# Patient Record
Sex: Female | Born: 1995
Health system: Southern US, Community
[De-identification: ages and names within clinical notes are randomized; demographics above are authoritative.]

## PROBLEM LIST (undated history)

## (undated) DIAGNOSIS — M797 Fibromyalgia: Secondary | ICD-10-CM

## (undated) DIAGNOSIS — G90A Postural orthostatic tachycardia syndrome (POTS): Secondary | ICD-10-CM

## (undated) DIAGNOSIS — I498 Other specified cardiac arrhythmias: Secondary | ICD-10-CM

## (undated) DIAGNOSIS — L88 Pyoderma gangrenosum: Secondary | ICD-10-CM

## (undated) DIAGNOSIS — R000 Tachycardia, unspecified: Secondary | ICD-10-CM

## (undated) DIAGNOSIS — I951 Orthostatic hypotension: Secondary | ICD-10-CM

## (undated) HISTORY — PX: BREAST SURGERY: SHX581

## (undated) HISTORY — PX: KNEE SURGERY: SHX244

## (undated) HISTORY — PX: ANKLE SURGERY: SHX546

## (undated) HISTORY — PX: ADENOIDECTOMY: SUR15

## (undated) HISTORY — PX: TONSILLECTOMY: SUR1361

## (undated) HISTORY — PX: OTHER SURGICAL HISTORY: SHX169

---
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
## 2013-02-01 ENCOUNTER — Other Ambulatory Visit: Payer: Self-pay | Admitting: *Deleted

## 2013-02-01 MED ORDER — CYCLOBENZAPRINE HCL 5 MG PO TABS: ORAL_TABLET | ORAL | Status: DC

## 2013-02-16 ENCOUNTER — Ambulatory Visit (INDEPENDENT_AMBULATORY_CARE_PROVIDER_SITE_OTHER): Payer: 59 | Admitting: Family Medicine

## 2013-02-16 ENCOUNTER — Encounter: Payer: Self-pay | Admitting: Family Medicine

## 2013-02-16 VITALS — BP 120/70 | Ht 64.0 in | Wt 150.0 lb

## 2013-02-16 DIAGNOSIS — M629 Disorder of muscle, unspecified: Secondary | ICD-10-CM

## 2013-02-16 DIAGNOSIS — M7631 Iliotibial band syndrome, right leg: Secondary | ICD-10-CM

## 2013-02-21 NOTE — Progress Notes (Signed)
   Subjective:    Patient ID: Amber Tyler, female    DOB: 04-Jul-1995, 17 y.o.   MRN: [id number]  HPI Right leg pain. 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 After yesterday's PT, she felt some aching in the right side of her knee. Today when she woke up it was worse. The swelling. No giving way.   Review of Systems See history of present illness.    Objective:   Physical Exam Vital signs are reviewed GENERAL: Well-developed female no acute distress KNEE: Right. Full range of motion in flexion extension. Ligamentously intact to varus and valgus stress. Tender to palpation at the insertion of the IT band calf is soft. Distally she is neurovascularly intact. There is no knee effusion. No erythema, no warmth.       Assessment & Plan:  Mild IT band strain. Discussed rehabilitation exercises.

## 2013-03-05 ENCOUNTER — Other Ambulatory Visit: Payer: Self-pay | Admitting: Family Medicine

## 2013-03-05 MED ORDER — PANTOPRAZOLE SODIUM 40 MG PO TBEC: 40.0000 mg | DELAYED_RELEASE_TABLET | Freq: Every day | ORAL | Status: DC

## 2013-04-04 ENCOUNTER — Encounter: Payer: Self-pay | Admitting: Family Medicine

## 2013-04-04 ENCOUNTER — Ambulatory Visit (INDEPENDENT_AMBULATORY_CARE_PROVIDER_SITE_OTHER): Payer: 59 | Admitting: Family Medicine

## 2013-04-04 VITALS — BP 112/66 | HR 89 | Temp 98.3°F | Wt 160.5 lb

## 2013-04-04 DIAGNOSIS — R51 Headache: Secondary | ICD-10-CM

## 2013-04-04 DIAGNOSIS — R519 Headache, unspecified: Secondary | ICD-10-CM

## 2013-04-04 MED ORDER — VERAPAMIL HCL ER 100 MG PO CP24: 100.0000 mg | ORAL_CAPSULE | Freq: Every day | ORAL | Status: DC

## 2013-04-05 DIAGNOSIS — R519 Headache, unspecified: Secondary | ICD-10-CM | POA: Insufficient documentation

## 2013-04-05 DIAGNOSIS — R51 Headache: Principal | ICD-10-CM

## 2013-04-05 NOTE — Progress Notes (Signed)
   Subjective:    Patient ID: Amber Tyler, female    DOB: Mar 01, 1996, 18 y.o.   MRN: [id number]  HPI #1. Complaint of headaches. Having them or 5 days out of the week. Light sensitivity. Right side of the top part of her head. No or. No emesis. No specific trigger. Has not been on her Depakote for several months secondary to perceived weight gain. She says her meds have been pretty stable.   Review of Systems Other than sensitivity to light during the headache she's had no visual disturbances, no tinnitus, no weakness.    Objective:   Physical Exam  Vital signs reviewed. GENERAL: Well-developed, well-nourished, no acute distress. HEENT: Pupils equal reactive to light accommodation. No notable icterus. Extraocular muscles intact. CARDIOVASCULAR: Regular rate and rhythm no murmur gallop or rub LUNGS: Clear to auscultation bilaterally, no rales or wheeze. ABDOMEN: Soft positive bowel sounds NEURO: No gross focal neurological deficits. MSK: Movement of extremity x 4. PSYCHIATRIC: Alert and oriented x4. Normal affect. Interactive. Speech is normal in content and fluency. No agitation or psychomotor retardation.        Assessment & Plan:  #1. Headaches. Unclear exactly what these are. We'll start her on some parenteral female to see if we can decrease the frequency. I wanted her to go back on her Depakote but she does not do that secondary weight gain. I'll see her back in one month.

## 2013-04-13 ENCOUNTER — Encounter: Payer: Self-pay | Admitting: Family Medicine

## 2013-04-13 ENCOUNTER — Other Ambulatory Visit: Payer: Self-pay | Admitting: Family Medicine

## 2013-04-13 DIAGNOSIS — R51 Headache: Principal | ICD-10-CM

## 2013-04-13 DIAGNOSIS — R519 Headache, unspecified: Secondary | ICD-10-CM

## 2013-04-13 MED ORDER — METOPROLOL TARTRATE 25 MG PO TABS: 25.0000 mg | ORAL_TABLET | Freq: Two times a day (BID) | ORAL | Status: DC

## 2013-04-13 NOTE — Assessment & Plan Note (Signed)
Can't afford verapamil

## 2013-04-13 NOTE — Progress Notes (Signed)
Patient ID: Amber Tyler, female   DOB: 12-08-1995, 18 y.o.   MRN: [id number] Can't afford verapamil

## 2013-05-09 ENCOUNTER — Ambulatory Visit: Payer: 59 | Admitting: Family Medicine

## 2013-06-26 ENCOUNTER — Ambulatory Visit: Payer: 59

## 2013-08-02 ENCOUNTER — Other Ambulatory Visit: Payer: Self-pay | Admitting: *Deleted

## 2013-08-02 MED ORDER — PANTOPRAZOLE SODIUM 40 MG PO TBEC: 40.0000 mg | DELAYED_RELEASE_TABLET | Freq: Every day | ORAL | Status: DC

## 2013-08-03 ENCOUNTER — Encounter: Payer: Self-pay | Admitting: Family Medicine

## 2013-08-08 ENCOUNTER — Emergency Department (HOSPITAL_BASED_OUTPATIENT_CLINIC_OR_DEPARTMENT_OTHER): Payer: 59

## 2013-08-08 ENCOUNTER — Encounter (HOSPITAL_BASED_OUTPATIENT_CLINIC_OR_DEPARTMENT_OTHER): Payer: Self-pay | Admitting: Emergency Medicine

## 2013-08-08 ENCOUNTER — Emergency Department (HOSPITAL_BASED_OUTPATIENT_CLINIC_OR_DEPARTMENT_OTHER)
Admission: EM | Admit: 2013-08-08 | Discharge: 2013-08-08 | Disposition: A | Discharge status: Home / Self Care | Payer: 59 | Attending: Emergency Medicine | Admitting: Emergency Medicine

## 2013-08-08 DIAGNOSIS — S63509A Unspecified sprain of unspecified wrist, initial encounter: Secondary | ICD-10-CM | POA: Insufficient documentation

## 2013-08-08 DIAGNOSIS — X58XXXA Exposure to other specified factors, initial encounter: Secondary | ICD-10-CM | POA: Insufficient documentation

## 2013-08-08 DIAGNOSIS — J45909 Unspecified asthma, uncomplicated: Secondary | ICD-10-CM | POA: Insufficient documentation

## 2013-08-08 DIAGNOSIS — Z79899 Other long term (current) drug therapy: Secondary | ICD-10-CM | POA: Insufficient documentation

## 2013-08-08 DIAGNOSIS — Z87891 Personal history of nicotine dependence: Secondary | ICD-10-CM | POA: Insufficient documentation

## 2013-08-08 DIAGNOSIS — Y939 Activity, unspecified: Secondary | ICD-10-CM | POA: Insufficient documentation

## 2013-08-08 DIAGNOSIS — Y929 Unspecified place or not applicable: Secondary | ICD-10-CM | POA: Insufficient documentation

## 2013-08-08 DIAGNOSIS — S63502A Unspecified sprain of left wrist, initial encounter: Secondary | ICD-10-CM

## 2013-08-08 MED ORDER — IBUPROFEN 800 MG PO TABS: 800.0000 mg | ORAL_TABLET | Freq: Three times a day (TID) | ORAL | Status: DC

## 2013-08-08 MED ORDER — HYDROCODONE-ACETAMINOPHEN 5-325 MG PO TABS: 1.0000 | ORAL_TABLET | ORAL | Status: DC | PRN

## 2013-08-08 NOTE — ED Provider Notes (Signed)
CSN: [id number]     Arrival date & time 08/08/13  2031 History   First MD Initiated Contact with Patient 08/08/13 2131     Chief Complaint  Patient presents with  . Wrist Pain     (Consider location/radiation/quality/duration/timing/severity/associated sxs/prior Treatment) Patient is a 18 y.o. female presenting with wrist pain. The history is provided by the patient. No language interpreter was used.  Wrist Pain This is a new problem. The current episode started yesterday. The problem occurs constantly. The problem has been gradually worsening. Pertinent negatives include no chills, fever or numbness. Associated symptoms comments: Left wrist pain without known injury. She has noticed pain in the ulnar wrist that extends to the 4th and 5th digits. Painful movement without numbness or tingling..    Past Medical History  Diagnosis Date  . Asthma    Past Surgical History  Procedure Laterality Date  . Tonsillectomy    . Adenoidectomy     History reviewed. No pertinent family history. History  Substance Use Topics  . Smoking status: Former Research scientist (life sciences)  . Smokeless tobacco: Not on file  . Alcohol Use: No   OB History   Grav Para Term Preterm Abortions TAB SAB Ect Mult Living                 Review of Systems  Constitutional: Negative for fever and chills.  Musculoskeletal:       See HPI.  Skin: Negative.  Negative for color change.  Neurological: Negative.  Negative for numbness.      Allergies  Ondansetron hcl; Sulfonamide derivatives; and Zoloft  Home Medications   Prior to Admission medications   Medication Sig Start Date End Date Taking? Authorizing Provider  cyclobenzaprine (FLEXERIL) 5 MG tablet Take one by mouth at night prn headache 02/01/13   Dickie La, MD  metoprolol tartrate (LOPRESSOR) 25 MG tablet Take 1 tablet (25 mg total) by mouth 2 (two) times daily. 04/13/13   Zigmund Gottron, MD  pantoprazole (PROTONIX) 40 MG tablet Take 1 tablet (40 mg total) by  mouth daily. 08/02/13   Dickie La, MD   BP 121/79  Pulse 114  Temp(Src) 98.3 F (36.8 C) (Oral)  Resp 20  Ht 5\' 4"  (1.626 m)  Wt 165 lb (74.844 kg)  BMI 28.31 kg/m2  SpO2 100%  LMP 07/27/2013 Physical Exam  Constitutional: She is oriented to person, place, and time. She appears well-developed and well-nourished.  HENT:  Head: Normocephalic.  Neck: Normal range of motion.  Pulmonary/Chest: Effort normal.  Abdominal: There is no tenderness. There is no rebound and no guarding.  Musculoskeletal: Normal range of motion.  Left wrist without significant swelling. No discoloration. Point tenderness over ulnar aspect at dorsum and volar surfaces. Limited finger dexterity secondary to pain. No strength deficit.  Neurological: She is alert and oriented to person, place, and time.  Skin: Skin is warm and dry. No rash noted.  Psychiatric: She has a normal mood and affect.    ED Course  Procedures (including critical care time) Labs Review Labs Reviewed - No data to display  Imaging Review Dg Wrist Complete Left  08/08/2013   CLINICAL DATA:  Left wrist pain  EXAM: LEFT WRIST - COMPLETE 3+ VIEW  COMPARISON:  None.  FINDINGS: There is no evidence of fracture or dislocation. There is no evidence of arthropathy or other focal bone abnormality. Soft tissues are unremarkable.  IMPRESSION: Negative.   Electronically Signed   By: Tamera Punt.D.  On: 08/08/2013 21:09     EKG Interpretation None      MDM   Final diagnoses:  None    1. Left wrist sprain  Uncomplicated left wrist sprain.    Dewaine Oats, PA-C 08/08/13 2206

## 2013-08-08 NOTE — ED Notes (Signed)
Pt c/o left wrist pain with swelling x 1 day denies injury

## 2013-08-08 NOTE — ED Provider Notes (Signed)
Medical screening examination/treatment/procedure(s) were performed by non-physician practitioner and as supervising physician I was immediately available for consultation/collaboration.   EKG Interpretation None        Charles B. Karle Starch, MD 08/08/13 2207

## 2013-08-08 NOTE — Discharge Instructions (Signed)
Cryotherapy Cryotherapy means treatment with cold. Ice or gel packs can be used to reduce both pain and swelling. Ice is the most helpful within the first 24 to 48 hours after an injury or flareup from overusing a muscle or joint. Sprains, strains, spasms, burning pain, shooting pain, and aches can all be eased with ice. Ice can also be used when recovering from surgery. Ice is effective, has very few side effects, and is safe for most people to use. PRECAUTIONS  Ice is not a safe treatment option for people with:  Raynaud's phenomenon. This is a condition affecting small blood vessels in the extremities. Exposure to cold may cause your problems to return.  Cold hypersensitivity. There are many forms of cold hypersensitivity, including:  Cold urticaria. Red, itchy hives appear on the skin when the tissues begin to warm after being iced.  Cold erythema. This is a red, itchy rash caused by exposure to cold.  Cold hemoglobinuria. Red blood cells break down when the tissues begin to warm after being iced. The hemoglobin that carry oxygen are passed into the urine because they cannot combine with blood proteins fast enough.  Numbness or altered sensitivity in the area being iced. If you have any of the following conditions, do not use ice until you have discussed cryotherapy with your caregiver:  Heart conditions, such as arrhythmia, angina, or chronic heart disease.  High blood pressure.  Healing wounds or open skin in the area being iced.  Current infections.  Rheumatoid arthritis.  Poor circulation.  Diabetes. Ice slows the blood flow in the region it is applied. This is beneficial when trying to stop inflamed tissues from spreading irritating chemicals to surrounding tissues. However, if you expose your skin to cold temperatures for too long or without the proper protection, you can damage your skin or nerves. Watch for signs of skin damage due to cold. HOME CARE INSTRUCTIONS Follow  these tips to use ice and cold packs safely.  Place a dry or damp towel between the ice and skin. A damp towel will cool the skin more quickly, so you may need to shorten the time that the ice is used.  For a more rapid response, add gentle compression to the ice.  Ice for no more than 10 to 20 minutes at a time. The bonier the area you are icing, the less time it will take to get the benefits of ice.  Check your skin after 5 minutes to make sure there are no signs of a poor response to cold or skin damage.  Rest 20 minutes or more in between uses.  Once your skin is numb, you can end your treatment. You can test numbness by very lightly touching your skin. The touch should be so light that you do not see the skin dimple from the pressure of your fingertip. When using ice, most people will feel these normal sensations in this order: cold, burning, aching, and numbness.  Do not use ice on someone who cannot communicate their responses to pain, such as small children or people with dementia. HOW TO MAKE AN ICE PACK Ice packs are the most common way to use ice therapy. Other methods include ice massage, ice baths, and cryo-sprays. Muscle creams that cause a cold, tingly feeling do not offer the same benefits that ice offers and should not be used as a substitute unless recommended by your caregiver. To make an ice pack, do one of the following:  Place crushed ice or  a bag of frozen vegetables in a sealable plastic bag. Squeeze out the excess air. Place this bag inside another plastic bag. Slide the bag into a pillowcase or place a damp towel between your skin and the bag. °· Mix 3 parts water with 1 part rubbing alcohol. Freeze the mixture in a sealable plastic bag. When you remove the mixture from the freezer, it will be slushy. Squeeze out the excess air. Place this bag inside another plastic bag. Slide the bag into a pillowcase or place a damp towel between your skin and the bag. °SEEK MEDICAL  CARE IF: °· You develop white spots on your skin. This may give the skin a blotchy (mottled) appearance. °· Your skin turns blue or pale. °· Your skin becomes waxy or hard. °· Your swelling gets worse. °MAKE SURE YOU:  °· Understand these instructions. °· Will watch your condition. °· Will get help right away if you are not doing well or get worse. °Document Released: 11/09/2010 Document Revised: 06/07/2011 Document Reviewed: 11/09/2010 °ExitCare® Patient Information ©2014 [employer]. ° °Joint Sprain °A sprain is a tear or stretch in the ligaments that hold a joint together. Severe sprains may need as long as 3-6 weeks of immobilization and/or exercises to heal completely. Sprained joints should be rested and protected. If not, they can become unstable and prone to re-injury. Proper treatment can reduce your pain, shorten the period of disability, and reduce the risk of repeated injuries. °TREATMENT  °· Rest and elevate the injured joint to reduce pain and swelling. °· Apply ice packs to the injury for 20-30 minutes every 2-3 hours for the next 2-3 days. °· Keep the injury wrapped in a compression bandage or splint as long as the joint is painful or as instructed by your caregiver. °· Do not use the injured joint until it is completely healed to prevent re-injury and chronic instability. Follow the instructions of your caregiver. °· Long-term sprain management may require exercises and/or treatment by a physical therapist. Taping or special braces may help stabilize the joint until it is completely better. °SEEK MEDICAL CARE IF:  °· You develop increased pain or swelling of the joint. °· You develop increasing redness and warmth of the joint. °· You develop a fever. °· It becomes stiff. °· Your hand or foot gets cold or numb. °Document Released: 04/22/2004 Document Revised: 06/07/2011 Document Reviewed: 04/01/2008 °ExitCare® Patient Information ©2014 [employer]. ° °

## 2013-08-21 ENCOUNTER — Other Ambulatory Visit: Payer: Self-pay | Admitting: *Deleted

## 2013-08-21 DIAGNOSIS — R51 Headache: Principal | ICD-10-CM

## 2013-08-21 DIAGNOSIS — R519 Headache, unspecified: Secondary | ICD-10-CM

## 2013-08-21 MED ORDER — METOPROLOL TARTRATE 25 MG PO TABS: 25.0000 mg | ORAL_TABLET | Freq: Two times a day (BID) | ORAL | Status: DC

## 2013-09-24 ENCOUNTER — Other Ambulatory Visit: Payer: Self-pay | Admitting: *Deleted

## 2013-09-24 MED ORDER — DESOGESTREL-ETHINYL ESTRADIOL 0.15-30 MG-MCG PO TABS: 1.0000 | ORAL_TABLET | Freq: Every day | ORAL | Status: DC

## 2013-10-13 ENCOUNTER — Emergency Department (HOSPITAL_BASED_OUTPATIENT_CLINIC_OR_DEPARTMENT_OTHER)
Admission: EM | Admit: 2013-10-13 | Discharge: 2013-10-13 | Disposition: A | Discharge status: Home / Self Care | Payer: 59 | Attending: Emergency Medicine | Admitting: Emergency Medicine

## 2013-10-13 ENCOUNTER — Encounter (HOSPITAL_BASED_OUTPATIENT_CLINIC_OR_DEPARTMENT_OTHER): Payer: Self-pay | Admitting: Emergency Medicine

## 2013-10-13 DIAGNOSIS — W57XXXA Bitten or stung by nonvenomous insect and other nonvenomous arthropods, initial encounter: Secondary | ICD-10-CM

## 2013-10-13 DIAGNOSIS — S90569A Insect bite (nonvenomous), unspecified ankle, initial encounter: Secondary | ICD-10-CM | POA: Insufficient documentation

## 2013-10-13 DIAGNOSIS — Z79899 Other long term (current) drug therapy: Secondary | ICD-10-CM | POA: Insufficient documentation

## 2013-10-13 DIAGNOSIS — L509 Urticaria, unspecified: Secondary | ICD-10-CM

## 2013-10-13 DIAGNOSIS — Y9389 Activity, other specified: Secondary | ICD-10-CM | POA: Insufficient documentation

## 2013-10-13 DIAGNOSIS — Z87891 Personal history of nicotine dependence: Secondary | ICD-10-CM | POA: Insufficient documentation

## 2013-10-13 DIAGNOSIS — J45909 Unspecified asthma, uncomplicated: Secondary | ICD-10-CM | POA: Insufficient documentation

## 2013-10-13 DIAGNOSIS — Y929 Unspecified place or not applicable: Secondary | ICD-10-CM | POA: Insufficient documentation

## 2013-10-13 MED ORDER — PREDNISONE 20 MG PO TABS: 60.0000 mg | ORAL_TABLET | Freq: Every day | ORAL | Status: DC

## 2013-10-13 MED ORDER — PREDNISONE 50 MG PO TABS
60.0000 mg | ORAL_TABLET | Freq: Once | ORAL | Status: AC
  Administered 2013-10-13: 60 mg via ORAL
  Filled 2013-10-13 (×2): qty 1

## 2013-10-13 MED ORDER — HYDROXYZINE HCL 25 MG PO TABS: 25.0000 mg | ORAL_TABLET | Freq: Four times a day (QID) | ORAL | Status: DC

## 2013-10-13 MED ORDER — HYDROXYZINE HCL 25 MG PO TABS
25.0000 mg | ORAL_TABLET | Freq: Once | ORAL | Status: AC
  Administered 2013-10-13: 25 mg via ORAL
  Filled 2013-10-13: qty 1

## 2013-10-13 NOTE — ED Provider Notes (Signed)
TIME SEEN: 10:51 PM  CHIEF COMPLAINT: insect bite  HPI Comments: Amber Tyler is a 18 y.o. female with history of asthma who presents to the Emergency Department complaining of insect bite onset two days ago. She states that she was sitting on the sidewalk outside her grandmother's house and was bit on her right leg. She states that the bite felt like a bee sting, but does not know what bit her. She reports that the rash has spread over her legs, chest and face. She reports that the rash is itching and burning. She states that she has taken benadryl and used hydrocortisone cream to no relief. She denies trouble breathing or swallowing. No wheezing or voice changes. She denies use of new soaps, lotions, detergents, medications, new foods or pet exposures. No fever. No tick bite.  ROS: See HPI Constitutional: no fever  Eyes: no drainage  ENT: no runny nose   Cardiovascular:  no chest pain  Resp: no SOB  GI: no vomiting GU: no dysuria Integumentary: rash Allergy:  Musculoskeletal: no leg swelling  Neurological: no slurred speech ROS otherwise negative  PAST MEDICAL HISTORY/PAST SURGICAL HISTORY:  Past Medical History  Diagnosis Date  . Asthma     MEDICATIONS:  Prior to Admission medications   Medication Sig Start Date End Date Taking? Authorizing Provider  cyclobenzaprine (FLEXERIL) 5 MG tablet Take one by mouth at night prn headache 02/01/13   Dickie La, MD  desogestrel-ethinyl estradiol (APRI,EMOQUETTE,SOLIA) 0.15-30 MG-MCG tablet Take 1 tablet by mouth daily. 09/24/13   Dickie La, MD  HYDROcodone-acetaminophen (NORCO/VICODIN) 5-325 MG per tablet Take 1-2 tablets by mouth every 4 (four) hours as needed. 08/08/13   Shari A Upstill, PA-C  ibuprofen (ADVIL,MOTRIN) 800 MG tablet Take 1 tablet (800 mg total) by mouth 3 (three) times daily. 08/08/13   Shari A Upstill, PA-C  metoprolol tartrate (LOPRESSOR) 25 MG tablet Take 1 tablet (25 mg total) by mouth 2 (two) times daily. 08/21/13    Dickie La, MD  pantoprazole (PROTONIX) 40 MG tablet Take 1 tablet (40 mg total) by mouth daily. 08/02/13   Dickie La, MD    ALLERGIES:  Allergies  Allergen Reactions  . Ondansetron Hcl     unknown  . Sulfonamide Derivatives     unknown  . Zoloft [Sertraline Hcl]     headache    SOCIAL HISTORY:  History  Substance Use Topics  . Smoking status: Former Research scientist (life sciences)  . Smokeless tobacco: Not on file  . Alcohol Use: No    FAMILY HISTORY: History reviewed. No pertinent family history.  EXAM: BP 120/84  Pulse 97  Temp(Src) 98.2 F (36.8 C) (Oral)  Resp 18  SpO2 99% CONSTITUTIONAL: Alert and oriented and responds appropriately to questions. Well-appearing; well-nourished HEAD: Normocephalic EYES: Conjunctivae clear, PERRL ENT: normal nose; no rhinorrhea; moist mucous membranes; pharynx without lesions noted, no angioedema, no trismus or drooling, airway patent, no voice changes, no lesions in her mouth her tongue, no stridor NECK: Supple, no meningismus, no LAD  CARD: RRR; S1 and S2 appreciated; no murmurs, no clicks, no rubs, no gallops RESP: Normal chest excursion without splinting or tachypnea; breath sounds clear and equal bilaterally; no wheezes, no rhonchi, no rales, no hypoxia or respiratory distress ABD/GI: Normal bowel sounds; non-distended; soft, non-tender, no rebound, no guarding BACK:  The back appears normal and is non-tender to palpation, there is no CVA tenderness EXT: Normal ROM in all joints; non-tender to palpation; no edema; normal capillary refill;  no cyanosis    SKIN: Normal color for age and race; warm, patient has multiple small erythematous papular lesions to her inner thighs, trunk and has associated urticaria. No rash on her palms or soles. NEURO: Moves all extremities equally PSYCH: The patient's mood and manner are appropriate. Grooming and personal hygiene are appropriate.  MEDICAL DECISION MAKING: Patient here with insect bites and associated hives.  No signs of superimposed infection. No other symptoms. We'll discharge her home on oral steroids. We'll discharge with prescription for Atarax for itching and have her continue Benadryl. Discussed strict return precautions and supportive care instructions. Patient verbalizes understanding and is comfortable with plan.       [place], DO 10/13/13 [id number]

## 2013-10-13 NOTE — Discharge Instructions (Signed)
Please continue Benadryl 50 mg every 8 hours as needed for itching.   Hives Hives are itchy, red, swollen areas of the skin. They can vary in size and location on your body. Hives can come and go for hours or several days (acute hives) or for several weeks (chronic hives). Hives do not spread from person to person (noncontagious). They may get worse with scratching, exercise, and emotional stress. CAUSES   Allergic reaction to food, additives, or drugs.  Infections, including the common cold.  Illness, such as vasculitis, lupus, or thyroid disease.  Exposure to sunlight, heat, or cold.  Exercise.  Stress.  Contact with chemicals. SYMPTOMS   Red or white swollen patches on the skin. The patches may change size, shape, and location quickly and repeatedly.  Itching.  Swelling of the hands, feet, and face. This may occur if hives develop deeper in the skin. DIAGNOSIS  Your caregiver can usually tell what is wrong by performing a physical exam. Skin or blood tests may also be done to determine the cause of your hives. In some cases, the cause cannot be determined. TREATMENT  Mild cases usually get better with medicines such as antihistamines. Severe cases may require an emergency epinephrine injection. If the cause of your hives is known, treatment includes avoiding that trigger.  HOME CARE INSTRUCTIONS   Avoid causes that trigger your hives.  Take antihistamines as directed by your caregiver to reduce the severity of your hives. Non-sedating or low-sedating antihistamines are usually recommended. Do not drive while taking an antihistamine.  Take any other medicines prescribed for itching as directed by your caregiver.  Wear loose-fitting clothing.  Keep all follow-up appointments as directed by your caregiver. SEEK MEDICAL CARE IF:   You have persistent or severe itching that is not relieved with medicine.  You have painful or swollen joints. SEEK IMMEDIATE MEDICAL CARE IF:    You have a fever.  Your tongue or lips are swollen.  You have trouble breathing or swallowing.  You feel tightness in the throat or chest.  You have abdominal pain. These problems may be the first sign of a life-threatening allergic reaction. Call your local emergency services (911 in [place] MAKE SURE YOU:   Understand these instructions.  Will watch your condition.  Will get help right away if you are not doing well or get worse. Document Released: 03/15/2005 Document Revised: 03/20/2013 Document Reviewed: 06/08/2011 [facility] Patient Information 2015 [place], [place]. This information is not intended to replace advice given to you by your health care provider. Make sure you discuss any questions you have with your health care provider.

## 2013-10-13 NOTE — ED Notes (Signed)
Pt reports insect bite on Friday and developed generalized rash

## 2013-11-14 ENCOUNTER — Encounter: Payer: Self-pay | Admitting: Family Medicine

## 2013-11-14 ENCOUNTER — Ambulatory Visit (INDEPENDENT_AMBULATORY_CARE_PROVIDER_SITE_OTHER): Payer: 59 | Admitting: Family Medicine

## 2013-11-14 VITALS — BP 114/82 | HR 98 | Temp 98.3°F | Wt 165.0 lb

## 2013-11-14 DIAGNOSIS — R5381 Other malaise: Secondary | ICD-10-CM

## 2013-11-14 DIAGNOSIS — R5383 Other fatigue: Principal | ICD-10-CM

## 2013-11-14 LAB — CBC WITH DIFFERENTIAL/PLATELET
Basophils Absolute: 0.1 10*3/uL (ref 0.0–0.1)
Basophils Relative: 1 % (ref 0–1)
Eosinophils Absolute: 0.2 10*3/uL (ref 0.0–0.7)
Eosinophils Relative: 2 % (ref 0–5)
HCT: 38 % (ref 36.0–46.0)
Hemoglobin: 12.7 g/dL (ref 12.0–15.0)
Lymphocytes Relative: 33 % (ref 12–46)
Lymphs Abs: 2.9 10*3/uL (ref 0.7–4.0)
MCH: 28.2 pg (ref 26.0–34.0)
MCHC: 33.4 g/dL (ref 30.0–36.0)
MCV: 84.3 fL (ref 78.0–100.0)
Monocytes Absolute: 0.7 10*3/uL (ref 0.1–1.0)
Monocytes Relative: 8 % (ref 3–12)
Neutro Abs: 4.9 10*3/uL (ref 1.7–7.7)
Neutrophils Relative %: 56 % (ref 43–77)
Platelets: 446 10*3/uL — ABNORMAL HIGH (ref 150–400)
RBC: 4.51 MIL/uL (ref 3.87–5.11)
RDW: 14 % (ref 11.5–15.5)
WBC: 8.7 10*3/uL (ref 4.0–10.5)

## 2013-11-14 LAB — COMPREHENSIVE METABOLIC PANEL
ALT: 11 U/L (ref 0–35)
AST: 16 U/L (ref 0–37)
Albumin: 3.9 g/dL (ref 3.5–5.2)
Alkaline Phosphatase: 112 U/L (ref 39–117)
BUN: 10 mg/dL (ref 6–23)
CO2: 27 mEq/L (ref 19–32)
Calcium: 9 mg/dL (ref 8.4–10.5)
Chloride: 105 mEq/L (ref 96–112)
Creat: 0.67 mg/dL (ref 0.50–1.10)
Glucose, Bld: 84 mg/dL (ref 70–99)
Potassium: 4.3 mEq/L (ref 3.5–5.3)
Sodium: 140 mEq/L (ref 135–145)
Total Bilirubin: 0.4 mg/dL (ref 0.2–1.2)
Total Protein: 6.8 g/dL (ref 6.0–8.3)

## 2013-11-14 LAB — CK: Total CK: 147 U/L (ref 7–177)

## 2013-11-14 LAB — TSH: TSH: 1.672 u[IU]/mL (ref 0.350–4.500)

## 2013-11-14 LAB — POCT SEDIMENTATION RATE: POCT SED RATE: 30 mm/hr — AB (ref 0–22)

## 2013-11-15 DIAGNOSIS — R5381 Other malaise: Secondary | ICD-10-CM | POA: Insufficient documentation

## 2013-11-15 DIAGNOSIS — R5383 Other fatigue: Principal | ICD-10-CM

## 2013-11-15 NOTE — Assessment & Plan Note (Signed)
We talked at length about her symptoms. A concerted supportive depressive episode but she is adamantly against that concept. Agreed to do some blood work on her to rule out issues. Recommend she get back into regular exercise program.Greater than 50% of our 45 minute office visit was spent in counseling and education regarding these issues.

## 2013-11-15 NOTE — Progress Notes (Signed)
   Subjective:    Patient ID: Amber Tyler, female    DOB: February 09, 1996, 18 y.o.   MRN: [id number]  HPI  Complaining of decreased energy and desire to really do anything physically active. This is occurred over the last 2-3 months. 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 She sleeps well at night but does not have a lot of energy during the day. She's not getting any regular exercise. Appetite is unchanged.  Review of Systems See history of present illness above. Additional pertinent review of systems is negative for fever, sweats, chills. She has had some mild weight gain. No arthralgias but she does complain of multiple areas of muscle pain. No change in bowel or bladder habits, no heartburn. She's not currently using any of her medications. No sore throat.    Objective:   Physical Exam   Vital signs reviewed. GENERAL: Well-developed, well-nourished, no acute distress. CARDIOVASCULAR: Regular rate and rhythm no murmur gallop or rub LUNGS: Clear to auscultation bilaterally, no rales or wheeze. ABDOMEN: Soft positive bowel sounds NEURO: No gross focal neurological deficits. MSK: Movement of extremity x 4.       Assessment & Plan:

## 2013-11-19 ENCOUNTER — Encounter: Payer: Self-pay | Admitting: Family Medicine

## 2013-11-19 ENCOUNTER — Other Ambulatory Visit: Payer: Self-pay | Admitting: Family Medicine

## 2013-11-19 DIAGNOSIS — D75839 Thrombocytosis, unspecified: Secondary | ICD-10-CM

## 2013-11-19 DIAGNOSIS — D473 Essential (hemorrhagic) thrombocythemia: Secondary | ICD-10-CM | POA: Insufficient documentation

## 2014-01-09 ENCOUNTER — Encounter (HOSPITAL_BASED_OUTPATIENT_CLINIC_OR_DEPARTMENT_OTHER): Payer: Self-pay | Admitting: Emergency Medicine

## 2014-01-09 ENCOUNTER — Emergency Department (HOSPITAL_BASED_OUTPATIENT_CLINIC_OR_DEPARTMENT_OTHER)
Admission: EM | Admit: 2014-01-09 | Discharge: 2014-01-09 | Disposition: A | Discharge status: Home / Self Care | Payer: 59 | Attending: Emergency Medicine | Admitting: Emergency Medicine

## 2014-01-09 ENCOUNTER — Emergency Department (HOSPITAL_BASED_OUTPATIENT_CLINIC_OR_DEPARTMENT_OTHER): Payer: 59

## 2014-01-09 DIAGNOSIS — Z79899 Other long term (current) drug therapy: Secondary | ICD-10-CM | POA: Diagnosis not present

## 2014-01-09 DIAGNOSIS — Y9389 Activity, other specified: Secondary | ICD-10-CM | POA: Insufficient documentation

## 2014-01-09 DIAGNOSIS — S060X0A Concussion without loss of consciousness, initial encounter: Secondary | ICD-10-CM | POA: Insufficient documentation

## 2014-01-09 DIAGNOSIS — S161XXA Strain of muscle, fascia and tendon at neck level, initial encounter: Secondary | ICD-10-CM

## 2014-01-09 DIAGNOSIS — Z87891 Personal history of nicotine dependence: Secondary | ICD-10-CM | POA: Diagnosis not present

## 2014-01-09 DIAGNOSIS — J45909 Unspecified asthma, uncomplicated: Secondary | ICD-10-CM | POA: Diagnosis not present

## 2014-01-09 DIAGNOSIS — Y92410 Unspecified street and highway as the place of occurrence of the external cause: Secondary | ICD-10-CM | POA: Diagnosis not present

## 2014-01-09 DIAGNOSIS — S0990XA Unspecified injury of head, initial encounter: Secondary | ICD-10-CM | POA: Diagnosis present

## 2014-01-09 HISTORY — DX: Fibromyalgia: M79.7
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
## 2014-01-09 NOTE — ED Notes (Signed)
Patient transported to CT 

## 2014-01-13 NOTE — ED Provider Notes (Signed)
Medical screening examination/treatment/procedure(s) were performed by non-physician practitioner and as supervising physician I was immediately available for consultation/collaboration.   EKG Interpretation None        Tanna Furry, MD 01/13/14 1326

## 2014-04-11 ENCOUNTER — Other Ambulatory Visit: Payer: Self-pay | Admitting: Family Medicine

## 2014-04-11 MED ORDER — CLINDAMYCIN HCL 150 MG PO CAPS: ORAL_CAPSULE | ORAL | Status: DC

## 2014-04-11 MED ORDER — PREDNISONE 20 MG PO TABS: 20.0000 mg | ORAL_TABLET | Freq: Every day | ORAL | Status: DC

## 2014-04-11 NOTE — Progress Notes (Signed)
She had new tattoo---her Mom had same identical tattoo done a day before she did (See chart Amber Tyler). Now Amber Tyler is having same redness around tatoo site. Given similarity of situation I now am thinking there may be something in tattoo ink that is causing local infalammatory rx other than an infectious process--I think best to cover for both. She sent me a pic of her tatoo. No fever. Clindamycin and prednisone and close f/u.

## 2014-06-15 ENCOUNTER — Encounter (HOSPITAL_BASED_OUTPATIENT_CLINIC_OR_DEPARTMENT_OTHER): Payer: Self-pay | Admitting: *Deleted

## 2014-06-15 ENCOUNTER — Emergency Department (HOSPITAL_BASED_OUTPATIENT_CLINIC_OR_DEPARTMENT_OTHER)
Admission: EM | Admit: 2014-06-15 | Discharge: 2014-06-15 | Disposition: A | Discharge status: Home / Self Care | Payer: 59 | Attending: Emergency Medicine | Admitting: Emergency Medicine

## 2014-06-15 DIAGNOSIS — Z87891 Personal history of nicotine dependence: Secondary | ICD-10-CM | POA: Diagnosis not present

## 2014-06-15 DIAGNOSIS — M797 Fibromyalgia: Secondary | ICD-10-CM | POA: Diagnosis present

## 2014-06-15 DIAGNOSIS — Z7952 Long term (current) use of systemic steroids: Secondary | ICD-10-CM | POA: Diagnosis not present

## 2014-06-15 DIAGNOSIS — Z79899 Other long term (current) drug therapy: Secondary | ICD-10-CM | POA: Diagnosis not present

## 2014-06-15 DIAGNOSIS — Z792 Long term (current) use of antibiotics: Secondary | ICD-10-CM | POA: Insufficient documentation

## 2014-06-15 DIAGNOSIS — J45909 Unspecified asthma, uncomplicated: Secondary | ICD-10-CM | POA: Diagnosis not present

## 2014-06-15 DIAGNOSIS — Z3202 Encounter for pregnancy test, result negative: Secondary | ICD-10-CM | POA: Insufficient documentation

## 2014-06-15 DIAGNOSIS — R51 Headache: Secondary | ICD-10-CM | POA: Diagnosis not present

## 2014-06-15 LAB — URINALYSIS, ROUTINE W REFLEX MICROSCOPIC
Bilirubin Urine: NEGATIVE
Glucose, UA: NEGATIVE mg/dL
Hgb urine dipstick: NEGATIVE
Ketones, ur: NEGATIVE mg/dL
Leukocytes, UA: NEGATIVE
Nitrite: NEGATIVE
Protein, ur: NEGATIVE mg/dL
Specific Gravity, Urine: 1.016 (ref 1.005–1.030)
Urobilinogen, UA: 1 mg/dL (ref 0.0–1.0)
pH: 6 (ref 5.0–8.0)

## 2014-06-15 LAB — PREGNANCY, URINE: Preg Test, Ur: NEGATIVE

## 2014-06-15 MED ORDER — DIPHENHYDRAMINE HCL 50 MG/ML IJ SOLN
25.0000 mg | Freq: Once | INTRAMUSCULAR | Status: AC
  Administered 2014-06-15: 25 mg via INTRAVENOUS
  Filled 2014-06-15: qty 1

## 2014-06-15 MED ORDER — KETOROLAC TROMETHAMINE 30 MG/ML IJ SOLN
30.0000 mg | Freq: Once | INTRAMUSCULAR | Status: AC
  Administered 2014-06-15: 30 mg via INTRAVENOUS
  Filled 2014-06-15: qty 1

## 2014-06-15 MED ORDER — SODIUM CHLORIDE 0.9 % IV BOLUS (SEPSIS)
1000.0000 mL | Freq: Once | INTRAVENOUS | Status: AC
  Administered 2014-06-15: 1000 mL via INTRAVENOUS

## 2014-06-15 MED ORDER — METOCLOPRAMIDE HCL 5 MG/ML IJ SOLN
10.0000 mg | Freq: Once | INTRAMUSCULAR | Status: AC
  Administered 2014-06-15: 10 mg via INTRAVENOUS
  Filled 2014-06-15: qty 2

## 2014-06-15 MED ORDER — HYDROCODONE-ACETAMINOPHEN 5-325 MG PO TABS: 1.0000 | ORAL_TABLET | Freq: Four times a day (QID) | ORAL | Status: DC | PRN

## 2014-06-15 NOTE — ED Notes (Signed)
Pt lying on L side, asymptomatic for low BP, "feel better", alert, NAD, calm, resting/ sleeping, "ready to go home".

## 2014-06-15 NOTE — ED Provider Notes (Signed)
CSN: [id number]     Arrival date & time 06/15/14  1828 History   First MD Initiated Contact with Patient 06/15/14 2013     Chief Complaint  Patient presents with  . Fibromyalgia     (Consider location/radiation/quality/duration/timing/severity/associated sxs/prior Treatment) HPI Comments: Patient is a 19 year old female past medical history significant for asthma, fibromyalgia presented to emergency department for fibromyalgia flareup. Patient is complaining of worsening pain in all of her joints. She endorses associated nausea with a generalized headache. Denies any vomiting, fever, falls or injury. States her last flareup was one month ago. States her pain is normally controlled with gabapentin as needed. She states no improvement from gabapentin today.    Past Medical History  Diagnosis Date  . Asthma   . Fibromyalgia    Past Surgical History  Procedure Laterality Date  . Tonsillectomy    . Adenoidectomy     No family history on file. History  Substance Use Topics  . Smoking status: Former Research scientist (life sciences)  . Smokeless tobacco: Not on file  . Alcohol Use: Yes     Comment: occ   OB History    No data available     Review of Systems  Respiratory: Negative for shortness of breath.   Cardiovascular: Negative for chest pain.  Gastrointestinal: Positive for nausea. Negative for vomiting and abdominal pain.  Musculoskeletal: Positive for myalgias and arthralgias.  Neurological: Positive for headaches. Negative for syncope.  All other systems reviewed and are negative.     Allergies  Ondansetron hcl and Sulfonamide derivatives  Home Medications   Prior to Admission medications   Medication Sig Start Date End Date Taking? Authorizing Provider  DULoxetine (CYMBALTA) 60 MG capsule Take 60 mg by mouth daily.   Yes Historical Provider, MD  gabapentin (NEURONTIN) 300 MG capsule Take 300 mg by mouth 4 (four) times daily.   Yes Historical Provider, MD  clindamycin (CLEOCIN) 150 MG  capsule Take three tabs every 8 hours 04/11/14   Dickie La, MD  HYDROcodone-acetaminophen (NORCO/VICODIN) 5-325 MG per tablet Take 1-2 tablets by mouth every 6 (six) hours as needed for severe pain. 06/15/14   Duriel Deery, PA-C  hydrOXYzine (ATARAX/VISTARIL) 25 MG tablet Take 1 tablet (25 mg total) by mouth every 6 (six) hours. 10/13/13   Kristen N Ward, DO  predniSONE (DELTASONE) 20 MG tablet Take 1 tablet (20 mg total) by mouth daily with breakfast. 04/11/14   Dickie La, MD   BP 102/66 mmHg  Pulse 89  Temp(Src) 98.7 F (37.1 C) (Oral)  Resp 18  Ht 5\' 5"  (1.651 m)  Wt 160 lb (72.576 kg)  BMI 26.63 kg/m2  SpO2 99%  LMP 06/03/2014 Physical Exam  Constitutional: She is oriented to person, place, and time. She appears well-developed and well-nourished. No distress.  HENT:  Head: Normocephalic and atraumatic.  Right Ear: External ear normal.  Left Ear: External ear normal.  Nose: Nose normal.  Mouth/Throat: Oropharynx is clear and moist. No oropharyngeal exudate.  Eyes: Conjunctivae and EOM are normal. Pupils are equal, round, and reactive to light.  Neck: Normal range of motion. Neck supple.  Cardiovascular: Normal rate, regular rhythm, normal heart sounds and intact distal pulses.   Pulmonary/Chest: Effort normal and breath sounds normal. No respiratory distress.  Abdominal: Soft. There is no tenderness.  Neurological: She is alert and oriented to person, place, and time. She has normal strength. No cranial nerve deficit. Gait normal. GCS eye subscore is 4. GCS verbal subscore is 5. GCS  motor subscore is 6.  Sensation grossly intact.  No pronator drift.  Bilateral heel-knee-shin intact.  Skin: Skin is warm and dry. She is not diaphoretic.  Nursing note and vitals reviewed.   ED Course  Procedures (including critical care time) Medications  sodium chloride 0.9 % bolus 1,000 mL (0 mLs Intravenous Stopped 06/15/14 2216)  diphenhydrAMINE (BENADRYL) injection 25 mg (25 mg  Intravenous Given 06/15/14 2148)  metoCLOPramide (REGLAN) injection 10 mg (10 mg Intravenous Given 06/15/14 2147)  ketorolac (TORADOL) 30 MG/ML injection 30 mg (30 mg Intravenous Given 06/15/14 2148)    Labs Review Labs Reviewed  URINALYSIS, ROUTINE W REFLEX MICROSCOPIC  PREGNANCY, URINE    Imaging Review No results found.   EKG Interpretation None      MDM   Final diagnoses:  Fibromyalgia    Filed Vitals:   06/15/14 2257  BP: 102/66  Pulse: 89  Temp: 98.7 F (37.1 C)  Resp: 18   Afebrile, NAD, non-toxic appearing, AAOx4. Physical examination is otherwise unremarkable. Pt HA and fibromyalgia flare treated and improved while in ED.  Presentation isnon concerning for [facility], ICH, Meningitis, or temporal arteritis. Pt is afebrile with no focal neuro deficits, nuchal rigidity, or change in vision. Large joints visualized without erythema, warmth, swelling. Pt is to follow up with PCP to discuss breakthrough pain medication. Pt verbalizes understanding and is agreeable with plan to dc.       Baron Sane, PA-C 06/16/14 [place], MD 06/16/14 1150

## 2014-06-15 NOTE — ED Notes (Signed)
C/o "fibromyalgia flare up". Onset 2 days ago. C/o pain in all joints with h/a. Nauseated no vomiting.

## 2014-06-15 NOTE — ED Notes (Signed)
Pt sleeping, arousable to voice, admits some continued pain, rates 4/10, denies other sx. Family at [facility].

## 2014-06-15 NOTE — ED Notes (Signed)
EDPA into room 

## 2014-06-15 NOTE — Discharge Instructions (Signed)
Please follow up with your primary care physician in 1-2 days. If you do not have one please call the [facility] number listed above. Please take pain medication and/or muscle relaxants as prescribed and as needed for pain. Please do not drive on narcotic pain medication or on muscle relaxants. Please read all discharge instructions and return precautions.   Fibromyalgia Fibromyalgia is a disorder that is often misunderstood. It is associated with muscular pains and tenderness that comes and goes. It is often associated with fatigue and sleep disturbances. Though it tends to be long-lasting, fibromyalgia is not life-threatening. CAUSES  The exact cause of fibromyalgia is unknown. People with certain gene types are predisposed to developing fibromyalgia and other conditions. Certain factors can play a role as triggers, such as:  Spine disorders.  Arthritis.  Severe injury (trauma) and other physical stressors.  Emotional stressors. SYMPTOMS   The main symptom is pain and stiffness in the muscles and joints, which can vary over time.  Sleep and fatigue problems. Other related symptoms may include:  Bowel and bladder problems.  Headaches.  Visual problems.  Problems with odors and noises.  Depression or mood changes.  Painful periods (dysmenorrhea).  Dryness of the skin or eyes. DIAGNOSIS  There are no specific tests for diagnosing fibromyalgia. Patients can be diagnosed accurately from the specific symptoms they have. The diagnosis is made by determining that nothing else is causing the problems. TREATMENT  There is no cure. Management includes medicines and an active, healthy lifestyle. The goal is to enhance physical fitness, decrease pain, and improve sleep. HOME CARE INSTRUCTIONS   Only take over-the-counter or prescription medicines as directed by your caregiver. Sleeping pills, tranquilizers, and pain medicines may make your problems  worse.  Low-impact aerobic exercise is very important and advised for treatment. At first, it may seem to make pain worse. Gradually increasing your tolerance will overcome this feeling.  Learning relaxation techniques and how to control stress will help you. Biofeedback, visual imagery, hypnosis, muscle relaxation, yoga, and meditation are all options.  Anti-inflammatory medicines and physical therapy may provide short-term help.  Acupuncture or massage treatments may help.  Take muscle relaxant medicines as suggested by your caregiver.  Avoid stressful situations.  Plan a healthy lifestyle. This includes your diet, sleep, rest, exercise, and friends.  Find and practice a hobby you enjoy.  Join a fibromyalgia support group for interaction, ideas, and sharing advice. This may be helpful. SEEK MEDICAL CARE IF:  You are not having good results or improvement from your treatment. FOR MORE INFORMATION  National Fibromyalgia Association: www.fmaware.[place]: www.arthritis.org Document Released: 03/15/2005 Document Revised: 06/07/2011 Document Reviewed: 06/25/2009 [facility] Patient Information 2015 [place], [place]. This information is not intended to replace advice given to you by your health care provider. Make sure you discuss any questions you have with your health care provider.

## 2014-12-01 DIAGNOSIS — R002 Palpitations: Secondary | ICD-10-CM | POA: Insufficient documentation

## 2015-06-05 ENCOUNTER — Ambulatory Visit (INDEPENDENT_AMBULATORY_CARE_PROVIDER_SITE_OTHER): Payer: BLUE CROSS/BLUE SHIELD | Admitting: Family Medicine

## 2015-06-05 ENCOUNTER — Encounter: Payer: Self-pay | Admitting: Family Medicine

## 2015-06-05 VITALS — BP 117/81 | HR 88 | Ht 65.0 in | Wt 165.0 lb

## 2015-06-05 DIAGNOSIS — S83006A Unspecified dislocation of unspecified patella, initial encounter: Secondary | ICD-10-CM | POA: Insufficient documentation

## 2015-06-05 DIAGNOSIS — S83004A Unspecified dislocation of right patella, initial encounter: Secondary | ICD-10-CM

## 2015-06-05 NOTE — Progress Notes (Signed)
  Amber Tyler - 20 y.o. female MRN [id number]  Date of birth: 1995/08/30  SUBJECTIVE:  Including CC & ROS.  Amber Tyler is a 20 y.o. female who presents today for R knee pain.    Knee Pain R, 06/05/15 visit - patient with recurrent patellar dislocations that began around 2 years ago with a twisting injury falling down stairs. She had a slight partial quadriceps tear and patellar dislocation was treated conservatively with leg immobilization and physical therapy to strengthen her quad muscle. Since that point she has had recurrent (2 many to count) lateral dislocations of the patella. Around 3 days ago she had a recurrent episode and had severe pain as well as ecchymosis in the lateral aspect of her leg. She has had trouble with ambulation since then. Denies any paresthesias instability or locking at this time.  PMHx - Updated and reviewed.  Contributory factors include: Bipolar Depression  PSHx - Updated and reviewed.  Contributory factors include:  T&A FHx - Updated and reviewed.  Contributory factors include:  Negative  Social Hx - Updated and reviewed. Contributory factors include: Non smoker   Medications - Updated/revivewed    12 point ROS negative other than per HPI.   Exam:  Filed Vitals:   06/05/15 1055  BP: 117/81  Pulse: 88   Gen: NAD, AAO 3 Cardio- RRR Pulm - Normal respiratory effort/rate Skin: No rashes or erythema Extremities: No edema  Vascular: pulses +2 bilateral upper and lower extremity Psych: Normal affect   R Knee:  Normal to inspection with no erythema or effusion or obvious bony abnormalities.  No obvious Baker's cysts Palpation normal with no warmth or joint line tenderness or patellar tenderness or condyle tenderness.  No TTP along infrapatellar or pes anserine bursas.   ROM normal in flexion (135 degrees) and extension (0 degrees) and lower leg rotation. Ligaments with solid consistent endpoints including ACL, PCL, LCL, MCL.  Negative Anterior  Drawer/Lachman/Pivot Shift Negative Mcmurray's and provocative meniscal tests including Thessaly and Apley compression testing  + Apprehension  Patellar and quadriceps tendons unremarkable. Hamstring and quadriceps strength is normal.  Neurovascularly intact B/L LE   Imaging:  MPFL of the R knee appears to be hypoechoic 2/2 previous tearing

## 2015-06-05 NOTE — Patient Instructions (Addendum)
[facility] Dr. Dorna Leitz Monday 06/09/15 at [address], [place],  [address] Phone: [phone]

## 2015-06-05 NOTE — Assessment & Plan Note (Signed)
Recurrent d/l of the patella laterally with most recent causing significant ecchymosis and pain - Referral to ortho as her MPFL appears completely torn on [place] today and expect she will need reconstruction - As well, she will need x-rays to r/o patellar OCD lesion/fx as well as possible MRI for pre-op planning.

## 2015-06-16 ENCOUNTER — Other Ambulatory Visit: Payer: Self-pay | Admitting: *Deleted

## 2015-06-16 MED ORDER — ACETAMINOPHEN-CODEINE #3 300-30 MG PO TABS: ORAL_TABLET | ORAL | Status: DC

## 2015-07-21 ENCOUNTER — Encounter (HOSPITAL_BASED_OUTPATIENT_CLINIC_OR_DEPARTMENT_OTHER): Payer: Self-pay | Admitting: Emergency Medicine

## 2015-07-21 DIAGNOSIS — Y939 Activity, unspecified: Secondary | ICD-10-CM | POA: Diagnosis not present

## 2015-07-21 DIAGNOSIS — Z87891 Personal history of nicotine dependence: Secondary | ICD-10-CM | POA: Insufficient documentation

## 2015-07-21 DIAGNOSIS — W19XXXA Unspecified fall, initial encounter: Secondary | ICD-10-CM | POA: Diagnosis not present

## 2015-07-21 DIAGNOSIS — Z79899 Other long term (current) drug therapy: Secondary | ICD-10-CM | POA: Insufficient documentation

## 2015-07-21 DIAGNOSIS — Y929 Unspecified place or not applicable: Secondary | ICD-10-CM | POA: Insufficient documentation

## 2015-07-21 DIAGNOSIS — J45909 Unspecified asthma, uncomplicated: Secondary | ICD-10-CM | POA: Diagnosis not present

## 2015-07-21 DIAGNOSIS — Z79891 Long term (current) use of opiate analgesic: Secondary | ICD-10-CM | POA: Diagnosis not present

## 2015-07-21 DIAGNOSIS — S8991XA Unspecified injury of right lower leg, initial encounter: Secondary | ICD-10-CM | POA: Diagnosis present

## 2015-07-21 DIAGNOSIS — S8001XA Contusion of right knee, initial encounter: Secondary | ICD-10-CM | POA: Insufficient documentation

## 2015-07-21 DIAGNOSIS — Y999 Unspecified external cause status: Secondary | ICD-10-CM | POA: Diagnosis not present

## 2015-07-21 NOTE — ED Notes (Signed)
Patient has surgery to her right knee on the 29th. The patient reports that she fell onto her right knee on Saturday and she is now having pain to her right knee that is not similar to surgical pain.

## 2015-07-22 ENCOUNTER — Emergency Department (HOSPITAL_BASED_OUTPATIENT_CLINIC_OR_DEPARTMENT_OTHER): Payer: BLUE CROSS/BLUE SHIELD

## 2015-07-22 ENCOUNTER — Emergency Department (HOSPITAL_BASED_OUTPATIENT_CLINIC_OR_DEPARTMENT_OTHER)
Admission: EM | Admit: 2015-07-22 | Discharge: 2015-07-22 | Disposition: A | Discharge status: Home / Self Care | Payer: BLUE CROSS/BLUE SHIELD | Attending: Emergency Medicine | Admitting: Emergency Medicine

## 2015-07-22 DIAGNOSIS — S8001XA Contusion of right knee, initial encounter: Secondary | ICD-10-CM

## 2015-07-22 MED ORDER — HYDROMORPHONE HCL 1 MG/ML IJ SOLN
2.0000 mg | Freq: Once | INTRAMUSCULAR | Status: AC
  Administered 2015-07-22: 2 mg via INTRAMUSCULAR
  Filled 2015-07-22: qty 2

## 2015-07-22 NOTE — ED Notes (Signed)
Pt in xray depart

## 2015-07-22 NOTE — ED Provider Notes (Signed)
CSN: [id number]     Arrival date & time 07/21/15  2224 History   First MD Initiated Contact with Patient 07/22/15 0106     Chief Complaint  Patient presents with  . Knee Pain     (Consider location/radiation/quality/duration/timing/severity/associated sxs/prior Treatment) HPI Comments: Patient is a 20 year old female who presents with complaints of right knee pain. She has a history of recurrent patellar dislocations and recently underwent surgery to correct this problem by Dr. Berenice Primas. She reports on Saturday falling and reinjuring this knee. She was not wearing her brace at the time. She has had ongoing discomfort that is unrelieved with home medications.  Patient is a 20 y.o. female presenting with knee pain. The history is provided by the patient.  Knee Pain Location:  Knee Time since incident:  2 days Injury: yes   Knee location:  R knee Pain details:    Radiates to:  Does not radiate   Severity:  Moderate   Timing:  Constant   Progression:  Worsening   Past Medical History  Diagnosis Date  . Asthma   . Fibromyalgia    Past Surgical History  Procedure Laterality Date  . Tonsillectomy    . Adenoidectomy     History reviewed. No pertinent family history. Social History  Substance Use Topics  . Smoking status: Former Research scientist (life sciences)  . Smokeless tobacco: None  . Alcohol Use: Yes     Comment: occ   OB History    No data available     Review of Systems  All other systems reviewed and are negative.     Allergies  Tape; Ondansetron hcl; and Sulfonamide derivatives  Home Medications   Prior to Admission medications   Medication Sig Start Date End Date Taking? Authorizing Provider  oxyCODONE-acetaminophen (PERCOCET/ROXICET) 5-325 MG tablet Take 1 tablet by mouth every 4 (four) hours as needed for severe pain.   Yes Historical Provider, MD  traMADol (ULTRAM) 50 MG tablet Take 50 mg by mouth every 6 (six) hours as needed.   Yes Historical Provider, MD   acetaminophen-codeine (TYLENOL #3) 300-30 MG tablet Take 1-2 BID prn 06/16/15   Thurman Coyer, DO  ALPRAZolam Duanne Moron) 0.5 MG tablet Take by mouth. 04/30/15   Historical Provider, MD  buPROPion (WELLBUTRIN XL) 150 MG 24 hr tablet Take by mouth. 04/30/15 04/29/16  Historical Provider, MD  cyclobenzaprine (FLEXERIL) 10 MG tablet Take by mouth. 11/28/14 11/28/15  Historical Provider, MD  DULoxetine (CYMBALTA) 60 MG capsule Take 60 mg by mouth daily.    Historical Provider, MD  hydrOXYzine (ATARAX/VISTARIL) 25 MG tablet Take 1 tablet (25 mg total) by mouth every 6 (six) hours. 10/13/13   Kristen N Ward, DO  metoprolol succinate (TOPROL XL) 25 MG 24 hr tablet Take by mouth. 02/06/15   Historical Provider, MD  predniSONE (DELTASONE) 20 MG tablet Take 1 tablet (20 mg total) by mouth daily with breakfast. 04/11/14   Dickie La, MD   BP 142/97 mmHg  Pulse 111  Temp(Src) 98.2 F (36.8 C) (Oral)  Ht 5\' 5"  (1.651 m)  Wt 170 lb (77.111 kg)  BMI 28.29 kg/m2  SpO2 100%  LMP 07/14/2015 Physical Exam  Constitutional: She is oriented to person, place, and time. She appears well-developed and well-nourished. No distress.  HENT:  Head: Normocephalic and atraumatic.  Neck: Normal range of motion. Neck supple.  Musculoskeletal:  The right knee has a prior surgical scar that appears to be healing well. There is no erythema or drainage. The patellar  appears in anatomic position. There is mild swelling, however no palpable effusion. Distal PMS is intact.  Neurological: She is alert and oriented to person, place, and time.  Skin: Skin is warm and dry. She is not diaphoretic.  Nursing note and vitals reviewed.   ED Course  Procedures (including critical care time) Labs Review Labs Reviewed - No data to display  Imaging Review No results found. I have personally reviewed and evaluated these images and lab results as part of my medical decision-making.    MDM   Final diagnoses:  None    Xrays negative for  acute abnormality.  Her pain was treated and she is to follow up with her orthopedist tomorrow to arrange a follow up appointment.    Veryl Speak, MD 07/22/15 [phone]

## 2015-07-22 NOTE — Discharge Instructions (Signed)
Continue taking your pain medication as previously prescribed.  Follow-up with your orthopedic surgeon tomorrow to arrange a follow-up appointment.   Contusion A contusion is a deep bruise. Contusions are the result of a blunt injury to tissues and muscle fibers under the skin. The injury causes bleeding under the skin. The skin overlying the contusion may turn blue, purple, or yellow. Minor injuries will give you a painless contusion, but more severe contusions may stay painful and swollen for a few weeks.  CAUSES  This condition is usually caused by a blow, trauma, or direct force to an area of the body. SYMPTOMS  Symptoms of this condition include:  Swelling of the injured area.  Pain and tenderness in the injured area.  Discoloration. The area may have redness and then turn blue, purple, or yellow. DIAGNOSIS  This condition is diagnosed based on a physical exam and medical history. An X-ray, CT scan, or MRI may be needed to determine if there are any associated injuries, such as broken bones (fractures). TREATMENT  Specific treatment for this condition depends on what area of the body was injured. In general, the best treatment for a contusion is resting, icing, applying pressure to (compression), and elevating the injured area. This is often called the RICE strategy. Over-the-counter anti-inflammatory medicines may also be recommended for pain control.  HOME CARE INSTRUCTIONS   Rest the injured area.  If directed, apply ice to the injured area:  Put ice in a plastic bag.  Place a towel between your skin and the bag.  Leave the ice on for 20 minutes, 2-3 times per day.  If directed, apply light compression to the injured area using an elastic bandage. Make sure the bandage is not wrapped too tightly. Remove and reapply the bandage as directed by your health care provider.  If possible, raise (elevate) the injured area above the level of your heart while you are sitting or lying  down.  Take over-the-counter and prescription medicines only as told by your health care provider. SEEK MEDICAL CARE IF:  Your symptoms do not improve after several days of treatment.  Your symptoms get worse.  You have difficulty moving the injured area. SEEK IMMEDIATE MEDICAL CARE IF:   You have severe pain.  You have numbness in a hand or foot.  Your hand or foot turns pale or cold.   This information is not intended to replace advice given to you by your health care provider. Make sure you discuss any questions you have with your health care provider.   Document Released: 12/23/2004 Document Revised: 12/04/2014 Document Reviewed: 07/31/2014 Elsevier Interactive Patient Education [employer].

## 2015-12-01 ENCOUNTER — Encounter (HOSPITAL_BASED_OUTPATIENT_CLINIC_OR_DEPARTMENT_OTHER): Payer: Self-pay | Admitting: Emergency Medicine

## 2015-12-01 ENCOUNTER — Emergency Department (HOSPITAL_BASED_OUTPATIENT_CLINIC_OR_DEPARTMENT_OTHER)
Admission: EM | Admit: 2015-12-01 | Discharge: 2015-12-01 | Disposition: A | Discharge status: Home / Self Care | Payer: BLUE CROSS/BLUE SHIELD | Attending: Emergency Medicine | Admitting: Emergency Medicine

## 2015-12-01 ENCOUNTER — Emergency Department (HOSPITAL_BASED_OUTPATIENT_CLINIC_OR_DEPARTMENT_OTHER): Payer: BLUE CROSS/BLUE SHIELD

## 2015-12-01 DIAGNOSIS — Y92007 Garden or yard of unspecified non-institutional (private) residence as the place of occurrence of the external cause: Secondary | ICD-10-CM | POA: Insufficient documentation

## 2015-12-01 DIAGNOSIS — Y999 Unspecified external cause status: Secondary | ICD-10-CM | POA: Insufficient documentation

## 2015-12-01 DIAGNOSIS — S82891A Other fracture of right lower leg, initial encounter for closed fracture: Secondary | ICD-10-CM | POA: Insufficient documentation

## 2015-12-01 DIAGNOSIS — W172XXA Fall into hole, initial encounter: Secondary | ICD-10-CM | POA: Diagnosis not present

## 2015-12-01 DIAGNOSIS — J45909 Unspecified asthma, uncomplicated: Secondary | ICD-10-CM | POA: Insufficient documentation

## 2015-12-01 DIAGNOSIS — Z87891 Personal history of nicotine dependence: Secondary | ICD-10-CM | POA: Insufficient documentation

## 2015-12-01 DIAGNOSIS — Y9301 Activity, walking, marching and hiking: Secondary | ICD-10-CM | POA: Diagnosis not present

## 2015-12-01 DIAGNOSIS — S8991XA Unspecified injury of right lower leg, initial encounter: Secondary | ICD-10-CM | POA: Diagnosis present

## 2015-12-01 HISTORY — DX: Postural orthostatic tachycardia syndrome (POTS): G90.A

## 2015-12-01 HISTORY — DX: Other specified cardiac arrhythmias: I49.8

## 2015-12-01 HISTORY — DX: Tachycardia, unspecified: R00.0

## 2015-12-01 HISTORY — DX: Orthostatic hypotension: I95.1

## 2015-12-01 MED ORDER — PROMETHAZINE HCL 25 MG PO TABS: 25.0000 mg | ORAL_TABLET | Freq: Four times a day (QID) | ORAL | 0 refills | Status: DC | PRN

## 2015-12-01 MED ORDER — PROMETHAZINE HCL 25 MG PO TABS
25.0000 mg | ORAL_TABLET | Freq: Once | ORAL | Status: AC
  Administered 2015-12-01: 25 mg via ORAL
  Filled 2015-12-01: qty 1

## 2015-12-01 MED ORDER — OXYCODONE-ACETAMINOPHEN 5-325 MG PO TABS
1.0000 | ORAL_TABLET | Freq: Once | ORAL | Status: AC
  Administered 2015-12-01: 1 via ORAL
  Filled 2015-12-01: qty 1

## 2015-12-01 MED ORDER — OXYCODONE-ACETAMINOPHEN 5-325 MG PO TABS: 1.0000 | ORAL_TABLET | ORAL | 0 refills | Status: DC | PRN

## 2015-12-01 NOTE — ED Provider Notes (Signed)
[facility] DEPT [facility] Provider Note   CSN: [id number] Arrival date & time: 12/01/15  1607  By signing my name below, I, Emmanuella Mensah, attest that this documentation has been prepared under the direction and in the presence of Julianne Rice, MD. Electronically Signed: Judithann Sauger, ED Scribe. 12/01/15. 11:12 PM.    History   Chief Complaint Chief Complaint  Patient presents with  . Ankle Injury    HPI Comments: Amber Tyler is a 20 y.o. female with a hx of fibromyalgia who presents to the Emergency Department complaining of acute onset pain and swelling to the lateral aspect of her right ankle s/p stepping in a hole that occurred PTA. She explains that she was walking in the backyard when she fell in her hole, hearing a "crack". No LOC or head injuries. She reports associated nausea. No alleviating factors noted. Pt has not tried any medications PTA. She reports a hx of right ankle reconstruction surgery by Dr. Berenice Primas 2 months ago. She also reports a hx of knee surgery. No fever, chills, vomiting, generalized rash, or any open wounds.  The history is provided by the patient. No language interpreter was used.  Ankle Injury  This is a recurrent problem. The current episode started 1 to 2 hours ago. The problem has been gradually worsening. Pertinent negatives include no chest pain, no abdominal pain and no shortness of breath. Nothing aggravates the symptoms. Nothing relieves the symptoms. She has tried nothing for the symptoms.    Past Medical History:  Diagnosis Date  . Asthma   . Fibromyalgia   . POTS (postural orthostatic tachycardia syndrome)     Patient Active Problem List   Diagnosis Date Noted  . 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 Awareness of heartbeats 12/01/2014  . Thrombocytosis ([place]) 11/19/2013  . Other malaise and fatigue 11/15/2013  . Frequent headaches 04/05/2013  . Dysmenorrhea 05/05/2012  . Concussion 02/29/2012  . Bipolar disorder ([place])  11/17/2011  . Overdose of antidepressant 11/16/2011  . Asthma 07/21/2010    Past Surgical History:  Procedure Laterality Date  . ADENOIDECTOMY    . ANKLE SURGERY Right   . KNEE SURGERY    . TONSILLECTOMY      OB History    No data available       Home Medications    Prior to Admission medications   Medication Sig Start Date End Date Taking? Authorizing Provider  acetaminophen-codeine (TYLENOL #3) 300-30 MG tablet Take 1-2 BID prn 06/16/15   Thurman Coyer, DO  ALPRAZolam Duanne Moron) 0.5 MG tablet Take by mouth. 04/30/15   Historical Provider, MD  buPROPion (WELLBUTRIN XL) 150 MG 24 hr tablet Take by mouth. 04/30/15 04/29/16  Historical Provider, MD  DULoxetine (CYMBALTA) 60 MG capsule Take 60 mg by mouth daily.    Historical Provider, MD  hydrOXYzine (ATARAX/VISTARIL) 25 MG tablet Take 1 tablet (25 mg total) by mouth every 6 (six) hours. 10/13/13   Kristen N Ward, DO  metoprolol succinate (TOPROL XL) 25 MG 24 hr tablet Take by mouth. 02/06/15   Historical Provider, MD  oxyCODONE-acetaminophen (PERCOCET/ROXICET) 5-325 MG tablet Take 1 tablet by mouth every 4 (four) hours as needed for severe pain. 12/01/15   Julianne Rice, MD  predniSONE (DELTASONE) 20 MG tablet Take 1 tablet (20 mg total) by mouth daily with breakfast. 04/11/14   Dickie La, MD  promethazine (PHENERGAN) 25 MG tablet Take 1 tablet (25 mg total) by mouth every 6 (six) hours as needed for nausea or vomiting. 12/01/15  Julianne Rice, MD  traMADol (ULTRAM) 50 MG tablet Take 50 mg by mouth every 6 (six) hours as needed.    Historical Provider, MD    Family History History reviewed. No pertinent family history.  Social History Social History  Substance Use Topics  . Smoking status: Former Research scientist (life sciences)  . Smokeless tobacco: Never Used  . Alcohol use Yes     Comment: occ     Allergies   Ondansetron hcl; Sulfonamide derivatives; and Tape   Review of Systems Review of Systems  Constitutional: Negative for chills and  fever.  Respiratory: Negative for shortness of breath.   Cardiovascular: Negative for chest pain.  Gastrointestinal: Positive for nausea. Negative for abdominal pain and vomiting.  Musculoskeletal: Positive for arthralgias and joint swelling. Negative for back pain, myalgias and neck pain.  Skin: Negative for rash and wound.  Neurological: Negative for weakness and numbness.  All other systems reviewed and are negative.   A complete 10 system review of systems was obtained and all systems are negative except as noted in the HPI and PMH.    Physical Exam Updated Vital Signs BP 116/78 (BP Location: Right Arm)   Pulse 94   Temp 98.4 F (36.9 C) (Oral)   Resp 18   Ht 5\' 5"  (1.651 m)   Wt 140 lb (63.5 kg)   LMP 12/01/2015 (Exact Date)   SpO2 100%   BMI 23.30 kg/m   Physical Exam  Constitutional: She is oriented to person, place, and time. She appears well-developed and well-nourished.  HENT:  Head: Normocephalic and atraumatic.  Mouth/Throat: Oropharynx is clear and moist.  Eyes: EOM are normal. Pupils are equal, round, and reactive to light.  Neck: Normal range of motion. Neck supple.  Cardiovascular: Normal rate and regular rhythm.   Pulmonary/Chest: Effort normal and breath sounds normal.  Abdominal: Soft. Bowel sounds are normal. There is no tenderness. There is no rebound and no guarding.  Musculoskeletal: Normal range of motion. She exhibits no edema or tenderness.  No proximal fibula tenderness. Patient has left lateral malleolar swelling and tenderness to palpation. No tenderness to the calcaneus or midfoot. 2+ dorsalis pedis pulses  Neurological: She is alert and oriented to person, place, and time.  Sensation intact. Moving all extremities without deficit.  Skin: Skin is warm and dry. Capillary refill takes less than 2 seconds. No rash noted. No erythema.  Psychiatric: She has a normal mood and affect. Her behavior is normal.  Nursing note and vitals reviewed.    ED  Treatments / Results  DIAGNOSTIC STUDIES: Oxygen Saturation is 100% on RA, normal by my interpretation.    COORDINATION OF CARE: 5:47 PM- Pt advised of plan for treatment and pt agrees. Pt informed of her x-ray results. Will apply ace wrap to ankle and advised to follow up with her Orthopedic surgeon.    Labs (all labs ordered are listed, but only abnormal results are displayed) Labs Reviewed - No data to display  EKG  EKG Interpretation None       Radiology Dg Ankle Complete Right  Result Date: 12/01/2015 CLINICAL DATA:  Pain after trauma. EXAM: RIGHT ANKLE - COMPLETE 3+ VIEW COMPARISON:  None. FINDINGS: Soft tissue swelling seen laterally. Calcifications in the soft tissues distal to the fibula are consistent with sequela of avulsion injuries, likely acute. No other fractures are seen. The ankle mortise is intact. IMPRESSION: Soft tissue swelling laterally. Calcifications in the lateral soft tissues adjacent to the distal fibula are most consistent with  sequela of an acute avulsion injury. Electronically Signed   By: Dorise Bullion III M.D   On: 12/01/2015 16:46    Procedures Procedures (including critical care time)  Medications Ordered in ED Medications  promethazine (PHENERGAN) tablet 25 mg (25 mg Oral Given 12/01/15 1808)  oxyCODONE-acetaminophen (PERCOCET/ROXICET) 5-325 MG per tablet 1 tablet (1 tablet Oral Given 12/01/15 1808)     Initial Impression / Assessment and Plan / ED Course  Julianne Rice, MD has reviewed the triage vital signs and the nursing notes.  Pertinent labs & imaging results that were available during my care of the patient were reviewed by me and considered in my medical decision making (see chart for details).  Clinical Course      Final Clinical Impressions(s) / ED Diagnoses   Final diagnoses:  Ankle fracture, right, closed, initial encounter    New Prescriptions Discharge Medication List as of 12/01/2015  6:37 PM    START taking these  medications   Details  promethazine (PHENERGAN) 25 MG tablet Take 1 tablet (25 mg total) by mouth every 6 (six) hours as needed for nausea or vomiting., Starting Mon 12/01/2015, Print       I personally performed the services described in this documentation, which was scribed in my presence. The recorded information has been reviewed and is accurate.      Julianne Rice, MD 12/01/15 2312

## 2015-12-01 NOTE — ED Notes (Signed)
MD at bedside. 

## 2015-12-01 NOTE — ED Triage Notes (Signed)
Patient reports falling in hole and injuring right ankle.  Reports 2 months ago had ankle reconstruction surgery.  Patient reports when falling today she heard something "crack".  Swelling noted to right lateral ankle.  Patient in NAD.

## 2015-12-01 NOTE — ED Notes (Signed)
Patient to xray.

## 2016-01-13 ENCOUNTER — Emergency Department (HOSPITAL_BASED_OUTPATIENT_CLINIC_OR_DEPARTMENT_OTHER): Payer: BLUE CROSS/BLUE SHIELD

## 2016-01-13 ENCOUNTER — Emergency Department (HOSPITAL_BASED_OUTPATIENT_CLINIC_OR_DEPARTMENT_OTHER)
Admission: EM | Admit: 2016-01-13 | Discharge: 2016-01-13 | Disposition: A | Discharge status: Home / Self Care | Payer: BLUE CROSS/BLUE SHIELD | Attending: Emergency Medicine | Admitting: Emergency Medicine

## 2016-01-13 ENCOUNTER — Encounter (HOSPITAL_BASED_OUTPATIENT_CLINIC_OR_DEPARTMENT_OTHER): Payer: Self-pay

## 2016-01-13 DIAGNOSIS — R110 Nausea: Secondary | ICD-10-CM | POA: Diagnosis not present

## 2016-01-13 DIAGNOSIS — R5383 Other fatigue: Secondary | ICD-10-CM | POA: Diagnosis not present

## 2016-01-13 DIAGNOSIS — R079 Chest pain, unspecified: Secondary | ICD-10-CM | POA: Insufficient documentation

## 2016-01-13 DIAGNOSIS — R55 Syncope and collapse: Secondary | ICD-10-CM | POA: Diagnosis not present

## 2016-01-13 DIAGNOSIS — J45909 Unspecified asthma, uncomplicated: Secondary | ICD-10-CM | POA: Diagnosis not present

## 2016-01-13 LAB — CBC
HCT: 40 % (ref 36.0–46.0)
Hemoglobin: 13.3 g/dL (ref 12.0–15.0)
MCH: 29 pg (ref 26.0–34.0)
MCHC: 33.3 g/dL (ref 30.0–36.0)
MCV: 87.1 fL (ref 78.0–100.0)
Platelets: 447 10*3/uL — ABNORMAL HIGH (ref 150–400)
RBC: 4.59 MIL/uL (ref 3.87–5.11)
RDW: 13.1 % (ref 11.5–15.5)
WBC: 11.4 10*3/uL — ABNORMAL HIGH (ref 4.0–10.5)

## 2016-01-13 LAB — COMPREHENSIVE METABOLIC PANEL
ALT: 11 U/L — ABNORMAL LOW (ref 14–54)
AST: 18 U/L (ref 15–41)
Albumin: 4.2 g/dL (ref 3.5–5.0)
Alkaline Phosphatase: 110 U/L (ref 38–126)
Anion gap: 8 (ref 5–15)
BUN: 8 mg/dL (ref 6–20)
CO2: 25 mmol/L (ref 22–32)
Calcium: 9.5 mg/dL (ref 8.9–10.3)
Chloride: 104 mmol/L (ref 101–111)
Creatinine, Ser: 0.68 mg/dL (ref 0.44–1.00)
GFR calc Af Amer: >60 mL/min (ref >60)
GFR calc non Af Amer: >60 mL/min (ref >60)
Glucose, Bld: 89 mg/dL (ref 65–99)
Potassium: 3.7 mmol/L (ref 3.5–5.1)
Sodium: 137 mmol/L (ref 135–145)
Total Bilirubin: 0.4 mg/dL (ref 0.3–1.2)
Total Protein: 7.9 g/dL (ref 6.5–8.1)

## 2016-01-13 LAB — TROPONIN I: Troponin I: <0.03 ng/mL (ref <0.03)

## 2016-01-13 LAB — D-DIMER, QUANTITATIVE (NOT AT ARMC): D-Dimer, Quant: 0.27 ug/mL-FEU (ref 0.00–0.50)

## 2016-01-13 MED ORDER — SODIUM CHLORIDE 0.9 % IV BOLUS (SEPSIS)
1000.0000 mL | Freq: Once | INTRAVENOUS | Status: AC
  Administered 2016-01-13: 1000 mL via INTRAVENOUS

## 2016-01-13 NOTE — ED Notes (Signed)
Patient placed on cardiac monitor with vitals set to Q 30 minutes.  

## 2016-01-13 NOTE — ED Triage Notes (Signed)
Pt c/o CP x today-also "passed out" x 3 today with hx of POTS and internal heart monitor-NAD-steady gait

## 2016-01-13 NOTE — ED Provider Notes (Signed)
[facility] DEPT [facility] Provider Note   CSN: [id number] Arrival date & time: 01/13/16  1601  By signing my name below, I, Dora Sims, attest that this documentation has been prepared under the direction and in the presence of [employer], PA-C. Electronically Signed: Dora Sims, Scribe. 01/13/2016. 5:15 PM.  History   Chief Complaint Chief Complaint  Patient presents with  . Chest Pain    The history is provided by the patient. No language interpreter was used.     HPI Comments: Amber Tyler is a 20 y.o. female with PMHx significant for POTS and PSHx significant for internal heart monitor who presents to the Emergency Department complaining of sudden onset, intermittent, left-sided chest pain beginning several hours ago. She states each episode of CP lasts around 20-30 minutes. Pt notes one episode of CP radiated into her neck. She denies CP currently. Pt states she experiences CP at baseline with exertion, but the chest pain she has experienced today is has been at rest although it feels the same. She reports she lost consciousness 3 times at work today and each episode lasted ~ 5 minutes; she states her coworkers informed her that she did not hit her head. She states she experiences syncopal episodes at baseline due to POTS, but 3 episodes in one day is unusual. She notes she felt like she was going to pass out before she lost consciousness. She reports 5/10 neck pain beginning this morning. Pt also reports associated dizziness, nausea, and generalized weakness; she notes her weakness presents once her CP dissipates. Pt states she has been feeling fatigued and weak for about one week. Pt has a history of fatigue. Pt denies h/o DVT, recent travel, recent surgery, increased caffeine intake, or hormone therapy use. She further denies fever, leg swelling, diaphoresis, numbness/tingling, vomiting, abdominal pain, headache, vision changes, ear pain, rhinorrhea, sore throat, fever, chills,  dysuria, hematuria, vaginal discharge, hematochezia, diarrhea, changes in bowel movements, pain/swelling of calves, or any other associated symptoms.  Past Medical History:  Diagnosis Date  . Asthma   . Fibromyalgia   . POTS (postural orthostatic tachycardia syndrome)     Patient Active Problem List   Diagnosis Date Noted  . 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 Awareness of heartbeats 12/01/2014  . Thrombocytosis ([place]) 11/19/2013  . Other malaise and fatigue 11/15/2013  . Frequent headaches 04/05/2013  . Dysmenorrhea 05/05/2012  . Concussion 02/29/2012  . Bipolar disorder ([place]) 11/17/2011  . Overdose of antidepressant 11/16/2011  . Asthma 07/21/2010    Past Surgical History:  Procedure Laterality Date  . ADENOIDECTOMY    . ANKLE SURGERY Right   . internal heart monitor    . KNEE SURGERY    . TONSILLECTOMY      OB History    No data available       Home Medications    Prior to Admission medications   Medication Sig Start Date End Date Taking? Authorizing Provider  buPROPion (WELLBUTRIN XL) 150 MG 24 hr tablet Take by mouth. 04/30/15 04/29/16  Historical Provider, MD  DULoxetine (CYMBALTA) 60 MG capsule Take 60 mg by mouth daily.    Historical Provider, MD  hydrOXYzine (ATARAX/VISTARIL) 25 MG tablet Take 1 tablet (25 mg total) by mouth every 6 (six) hours. 10/13/13   Kristen N Ward, DO  metoprolol succinate (TOPROL XL) 25 MG 24 hr tablet Take by mouth. 02/06/15   Historical Provider, MD    Family History No family history on file.  Social History Social History  Substance  Use Topics  . Smoking status: Never Smoker  . Smokeless tobacco: Never Used  . Alcohol use Yes     Comment: occ     Allergies   Ondansetron hcl; Sulfonamide derivatives; and Tape   Review of Systems Review of Systems  Constitutional: Positive for fatigue. Negative for chills, diaphoresis and fever.  HENT: Negative for ear pain, rhinorrhea and sore throat.   Eyes: Negative for visual  disturbance.  Cardiovascular: Positive for chest pain (left sided). Negative for leg swelling.  Gastrointestinal: Positive for nausea. Negative for abdominal pain, blood in stool, diarrhea and vomiting.  Genitourinary: Negative for dysuria, hematuria and vaginal discharge.  Musculoskeletal: Positive for neck pain.  Neurological: Positive for dizziness, syncope (3 episodes) and weakness (generalized). Negative for numbness and headaches.  All other systems reviewed and are negative.   Physical Exam Updated Vital Signs BP 116/86   Pulse 94   Temp 98.2 F (36.8 C) (Oral)   Resp 18   Ht 5\' 4"  (1.626 m)   Wt 150 lb (68 kg)   LMP 12/28/2015   SpO2 97%   BMI 25.75 kg/m   Physical Exam  Constitutional: She appears well-developed and well-nourished. No distress.  HENT:  Head: Normocephalic and atraumatic.  Eyes: Conjunctivae are normal.  Neck: Normal range of motion. Neck supple.  Cardiovascular: Normal rate, regular rhythm, normal heart sounds and intact distal pulses.  Exam reveals no gallop and no friction rub.   No murmur heard. Pulmonary/Chest: Effort normal and breath sounds normal. No respiratory distress. She has no decreased breath sounds. She has no wheezes. She has no rhonchi. She has no rales. She exhibits no tenderness.  Lungs CTA.  Abdominal: Soft. There is no tenderness. There is no rigidity and no guarding.  Musculoskeletal: Normal range of motion. She exhibits no edema.  2+ DP pulse bilaterally 2+ radial pulse bilaterally No edema.  Neurological: She is alert. She has normal strength. No sensory deficit. Coordination normal.  Strength 5/5.  Skin: Skin is warm and dry. She is not diaphoretic.  Psychiatric: She has a normal mood and affect. Her behavior is normal.  Nursing note and vitals reviewed.   ED Treatments / Results  Labs (all labs ordered are listed, but only abnormal results are displayed) Labs Reviewed  CBC - Abnormal; Notable for the following:        Result Value   WBC 11.4 (*)    Platelets 447 (*)    All other components within normal limits  COMPREHENSIVE METABOLIC PANEL - Abnormal; Notable for the following:    ALT 11 (*)    All other components within normal limits  TROPONIN I  D-DIMER, QUANTITATIVE (NOT AT [facility])    EKG  EKG Interpretation  Date/Time:  Tuesday January 13 2016 16:17:54 EDT Ventricular Rate:  100 PR Interval:  106 QRS Duration: 78 QT Interval:  334 QTC Calculation: 430 R Axis:   86 Text Interpretation:  Sinus rhythm with short PR Otherwise normal ECG No old tracing to compare Confirmed by GOLDSTON MD, SCOTT [phone]) on 01/13/2016 4:48:15 PM       Radiology Dg Chest 2 View  Result Date: 01/13/2016 CLINICAL DATA:  Chest pain. EXAM: CHEST  2 VIEW COMPARISON:  Radiographs of November 29, 2014. FINDINGS: The heart size and mediastinal contours are within normal limits. Both lungs are clear. No pneumothorax or pleural effusion is noted. The visualized skeletal structures are unremarkable. IMPRESSION: No active cardiopulmonary disease. Electronically Signed   By: Sabino Dick  Brooke Bonito, M.D.   On: 01/13/2016 17:57    Procedures Procedures (including critical care time)  DIAGNOSTIC STUDIES: Oxygen Saturation is 99% on RA, normal by my interpretation.    COORDINATION OF CARE: 5:33 PM Discussed treatment plan with pt at bedside and pt agreed to plan.  Medications Ordered in ED Medications  sodium chloride 0.9 % bolus 1,000 mL (0 mLs Intravenous Stopped 01/13/16 1827)     Initial Impression / Assessment and Plan / ED Course  I have reviewed the triage vital signs and the nursing notes.  Pertinent labs & imaging results that were available during my care of the patient were reviewed by me and considered in my medical decision making (see chart for details).  Clinical Course   Patient is to be discharged with recommendation to follow up with PCP in regards to today's hospital visit. Chest pain is not likely  of cardiac or pulmonary etiology d/t presentation, unable to [facility] due to tacycardia on arrival, VSS, no tracheal deviation, no JVD or new murmur, RRR, breath sounds equal bilaterally, EKG without acute abnormalities (pt previous EKG's have shown short PR interval and pt is aware of this), negative troponin, negative d-dimer, and negative CXR. Pt's Loop monitor showed no significant events today or since 09/25/2015. Pt was given fluids in the ED and she states she is feeling better and ready to leave. Pt has been advised start a PPI or Zantac and return to the ED if CP becomes exertional, associated with diaphoresis or nausea, radiates to left jaw/arm, worsens or becomes concerning in any way. Pt states she was on a PPI before but stopped taking it. Pt was tachycardic on arrival but this has resolved prior to discharge. VSS, afebrile and in not acute distress. Pt appears reliable for follow up and has been instructed to call her cardiologist in the morning to scheduled an appointment for this week. Discussed strict return precautions to the ED. I discussed all of the results with the patient and family members they have expressed their understanding to the verbal discharge instructions.   Case has been discussed with Dr. Regenia Skeeter who agrees with the above plan to discharge.   I personally performed the services described in this documentation, which was scribed in my presence. The recorded information has been reviewed and is accurate.    Final Clinical Impressions(s) / ED Diagnoses   Final diagnoses:  Chest pain, unspecified type  Syncope and collapse  Fatigue, unspecified type    New Prescriptions Discharge Medication List as of 01/13/2016  7:44 PM       Kalman Drape, PA 01/14/16 [place], PA 01/14/16 1221    Sherwood Gambler, MD 01/16/16 2328

## 2016-01-13 NOTE — ED Notes (Signed)
PA at bedside to discuss d/c.

## 2016-01-13 NOTE — Discharge Instructions (Signed)
Be sure to drink 20 of water, at least 8-10, 8 GLASSES of water per day. Try to avoid caffeine and alcohol. Follow-up with your cardiologist tomorrow regarding her visits to the emergency department today.  Return immediately to the emergency department if you experience fever, worsening chest pain, chest pain on exertion, chest pain associated with sweating, pain radiating into your jaw, back pain, nausea, vomiting, headache, visual changes, you pass out again, or any other concerning symptoms.

## 2016-02-11 ENCOUNTER — Emergency Department (HOSPITAL_BASED_OUTPATIENT_CLINIC_OR_DEPARTMENT_OTHER): Payer: BLUE CROSS/BLUE SHIELD

## 2016-02-11 ENCOUNTER — Emergency Department (HOSPITAL_BASED_OUTPATIENT_CLINIC_OR_DEPARTMENT_OTHER)
Admission: EM | Admit: 2016-02-11 | Discharge: 2016-02-11 | Disposition: A | Discharge status: Home / Self Care | Payer: BLUE CROSS/BLUE SHIELD | Attending: Physician Assistant | Admitting: Physician Assistant

## 2016-02-11 ENCOUNTER — Encounter (HOSPITAL_BASED_OUTPATIENT_CLINIC_OR_DEPARTMENT_OTHER): Payer: Self-pay

## 2016-02-11 DIAGNOSIS — J45909 Unspecified asthma, uncomplicated: Secondary | ICD-10-CM | POA: Insufficient documentation

## 2016-02-11 DIAGNOSIS — N3000 Acute cystitis without hematuria: Secondary | ICD-10-CM | POA: Diagnosis not present

## 2016-02-11 DIAGNOSIS — R112 Nausea with vomiting, unspecified: Secondary | ICD-10-CM | POA: Diagnosis not present

## 2016-02-11 DIAGNOSIS — R109 Unspecified abdominal pain: Secondary | ICD-10-CM

## 2016-02-11 DIAGNOSIS — R1030 Lower abdominal pain, unspecified: Secondary | ICD-10-CM | POA: Diagnosis present

## 2016-02-11 LAB — CBC WITH DIFFERENTIAL/PLATELET
Basophils Absolute: 0.1 10*3/uL (ref 0.0–0.1)
Basophils Relative: 0 %
Eosinophils Absolute: 0.1 10*3/uL (ref 0.0–0.7)
Eosinophils Relative: 0 %
HCT: 40.3 % (ref 36.0–46.0)
Hemoglobin: 13.5 g/dL (ref 12.0–15.0)
Lymphocytes Relative: 12 %
Lymphs Abs: 2.2 10*3/uL (ref 0.7–4.0)
MCH: 29 pg (ref 26.0–34.0)
MCHC: 33.5 g/dL (ref 30.0–36.0)
MCV: 86.5 fL (ref 78.0–100.0)
Monocytes Absolute: 1.4 10*3/uL — ABNORMAL HIGH (ref 0.1–1.0)
Monocytes Relative: 8 %
Neutro Abs: 14.9 10*3/uL — ABNORMAL HIGH (ref 1.7–7.7)
Neutrophils Relative %: 80 %
Platelets: 444 10*3/uL — ABNORMAL HIGH (ref 150–400)
RBC: 4.66 MIL/uL (ref 3.87–5.11)
RDW: 13.1 % (ref 11.5–15.5)
WBC: 18.6 10*3/uL — ABNORMAL HIGH (ref 4.0–10.5)

## 2016-02-11 LAB — URINALYSIS, ROUTINE W REFLEX MICROSCOPIC
Bilirubin Urine: NEGATIVE
Glucose, UA: NEGATIVE mg/dL
Ketones, ur: NEGATIVE mg/dL
Nitrite: NEGATIVE
Protein, ur: >300 mg/dL — AB
Specific Gravity, Urine: 1.018 (ref 1.005–1.030)
pH: 7.5 (ref 5.0–8.0)

## 2016-02-11 LAB — BASIC METABOLIC PANEL
Anion gap: 8 (ref 5–15)
BUN: 10 mg/dL (ref 6–20)
CO2: 25 mmol/L (ref 22–32)
Calcium: 9.6 mg/dL (ref 8.9–10.3)
Chloride: 104 mmol/L (ref 101–111)
Creatinine, Ser: 0.78 mg/dL (ref 0.44–1.00)
GFR calc Af Amer: >60 mL/min (ref >60)
GFR calc non Af Amer: >60 mL/min (ref >60)
Glucose, Bld: 112 mg/dL — ABNORMAL HIGH (ref 65–99)
Potassium: 3.5 mmol/L (ref 3.5–5.1)
Sodium: 137 mmol/L (ref 135–145)

## 2016-02-11 LAB — URINE MICROSCOPIC-ADD ON

## 2016-02-11 LAB — PREGNANCY, URINE: Preg Test, Ur: NEGATIVE

## 2016-02-11 MED ORDER — PHENAZOPYRIDINE HCL 200 MG PO TABS: 200.0000 mg | ORAL_TABLET | Freq: Three times a day (TID) | ORAL | 0 refills | Status: DC | PRN

## 2016-02-11 MED ORDER — CEPHALEXIN 500 MG PO CAPS: 500.0000 mg | ORAL_CAPSULE | Freq: Two times a day (BID) | ORAL | 0 refills | Status: DC

## 2016-02-11 MED ORDER — METOCLOPRAMIDE HCL 5 MG/ML IJ SOLN
10.0000 mg | Freq: Once | INTRAMUSCULAR | Status: AC
  Administered 2016-02-11: 10 mg via INTRAVENOUS
  Filled 2016-02-11: qty 2

## 2016-02-11 MED ORDER — KETOROLAC TROMETHAMINE 30 MG/ML IJ SOLN
30.0000 mg | Freq: Once | INTRAMUSCULAR | Status: AC
  Administered 2016-02-11: 30 mg via INTRAVENOUS
  Filled 2016-02-11: qty 1

## 2016-02-11 NOTE — ED Triage Notes (Signed)
C/o abd pain that radiates to back since 12pm-n/v-NAD-steady gait

## 2016-02-11 NOTE — ED Provider Notes (Signed)
[facility] DEPT [facility] Provider Note   CSN: [id number] Arrival date & time: 02/11/16  1548     History   Chief Complaint Chief Complaint  Patient presents with  . Abdominal Pain    HPI Amber Tyler is a 20 y.o. female.  The history is provided by the patient and medical records.  Abdominal Pain   Associated symptoms include nausea and vomiting.    20 y.o. F with hx of asthma, fibromyalgia, POTS, presenting to the ED for right flank pain.  States it began on Monday with some mild suprapubic abdominal pain. Reports symptoms have been worsening and got significantly worse today around noon. States she ate a half a sandwich for lunch and immediately started to have nausea, vomiting, and right flank pain.  She reports she has had multiple episodes of vomiting since onset. Reports she has had some dysuria and noticed some blood in her urine. States she has been taking over-the-counter AZO without relief.  She denies history of kidney stones but does report infrequent UTIs in the past. She's not had any fever or chills. No prior abdominal surgeries. States she had similar symptoms a few months ago was told that her appendix was "enlarged".  Past Medical History:  Diagnosis Date  . Asthma   . Fibromyalgia   . POTS (postural orthostatic tachycardia syndrome)     Patient Active Problem List   Diagnosis Date Noted  . 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 Awareness of heartbeats 12/01/2014  . Thrombocytosis ([place]) 11/19/2013  . Other malaise and fatigue 11/15/2013  . Frequent headaches 04/05/2013  . Dysmenorrhea 05/05/2012  . Concussion 02/29/2012  . Bipolar disorder ([place]) 11/17/2011  . Overdose of antidepressant 11/16/2011  . Asthma 07/21/2010    Past Surgical History:  Procedure Laterality Date  . ADENOIDECTOMY    . ANKLE SURGERY Right   . internal heart monitor    . KNEE SURGERY    . TONSILLECTOMY      OB History    No data available       Home Medications     Prior to Admission medications   Medication Sig Start Date End Date Taking? Authorizing Provider  buPROPion (WELLBUTRIN XL) 150 MG 24 hr tablet Take by mouth. 04/30/15 04/29/16  Historical Provider, MD  DULoxetine (CYMBALTA) 60 MG capsule Take 60 mg by mouth daily.    Historical Provider, MD  hydrOXYzine (ATARAX/VISTARIL) 25 MG tablet Take 1 tablet (25 mg total) by mouth every 6 (six) hours. 10/13/13   Kristen N Ward, DO  metoprolol succinate (TOPROL XL) 25 MG 24 hr tablet Take by mouth. 02/06/15   Historical Provider, MD    Family History No family history on file.  Social History Social History  Substance Use Topics  . Smoking status: Never Smoker  . Smokeless tobacco: Never Used  . Alcohol use Yes     Comment: occ     Allergies   Ondansetron hcl; Sulfonamide derivatives; and Tape   Review of Systems Review of Systems  Gastrointestinal: Positive for abdominal pain, nausea and vomiting.  Genitourinary: Positive for flank pain.  All other systems reviewed and are negative.    Physical Exam Updated Vital Signs BP 128/95 (BP Location: Left Arm)   Pulse 88   Temp 98.2 F (36.8 C) (Oral)   Resp 18   Ht 5\' 5"  (1.651 m)   Wt 76.7 kg   LMP 01/28/2016   SpO2 96%   BMI 28.12 kg/m   Physical Exam  Constitutional: She  is oriented to person, place, and time. She appears well-developed and well-nourished.  Appears uncomfortable, curled into fetal position  HENT:  Head: Normocephalic and atraumatic.  Mouth/Throat: Oropharynx is clear and moist.  Eyes: Conjunctivae and EOM are normal. Pupils are equal, round, and reactive to light.  Neck: Normal range of motion.  Cardiovascular: Normal rate, regular rhythm and normal heart sounds.   Pulmonary/Chest: Effort normal and breath sounds normal. No respiratory distress. She has no wheezes. She has no rales.  Abdominal: Soft. Bowel sounds are normal. There is tenderness. There is CVA tenderness. There is no rigidity and no guarding.   Right CVA tenderness  Musculoskeletal: Normal range of motion.  Neurological: She is alert and oriented to person, place, and time.  Skin: Skin is warm and dry.  Psychiatric: She has a normal mood and affect.  Nursing note and vitals reviewed.    ED Treatments / Results  Labs (all labs ordered are listed, but only abnormal results are displayed) Labs Reviewed  URINALYSIS, ROUTINE W REFLEX MICROSCOPIC (NOT AT [facility]) - Abnormal; Notable for the following:       Result Value   Color, Urine BROWN (*)    APPearance CLOUDY (*)    Hgb urine dipstick LARGE (*)    Protein, ur >300 (*)    Leukocytes, UA SMALL (*)    All other components within normal limits  CBC WITH DIFFERENTIAL/PLATELET - Abnormal; Notable for the following:    WBC 18.6 (*)    Platelets 444 (*)    Neutro Abs 14.9 (*)    Monocytes Absolute 1.4 (*)    All other components within normal limits  BASIC METABOLIC PANEL - Abnormal; Notable for the following:    Glucose, Bld 112 (*)    All other components within normal limits  URINE MICROSCOPIC-ADD ON - Abnormal; Notable for the following:    Squamous Epithelial / LPF 0-5 (*)    Bacteria, UA MANY (*)    All other components within normal limits  PREGNANCY, URINE    EKG  EKG Interpretation None       Radiology Ct Renal Stone Study  Result Date: 02/11/2016 CLINICAL DATA:  Right flank pain, nausea and vomiting. EXAM: CT ABDOMEN AND PELVIS WITHOUT CONTRAST TECHNIQUE: Multidetector CT imaging of the abdomen and pelvis was performed following the standard protocol without IV contrast. COMPARISON:  CT scan 07/04/2011 FINDINGS: Lower chest: The lung bases are clear of acute process. No pleural effusion or pulmonary lesions. The heart is normal in size. No pericardial effusion. The distal esophagus and aorta are unremarkable. Hepatobiliary: No focal hepatic lesions or intrahepatic biliary dilatation. The gallbladder is normal. No common bile duct dilatation. Pancreas: No  mass, inflammation or ductal dilatation. Spleen: Normal size.  No focal lesions. Adrenals/Urinary Tract: The adrenal glands and kidneys are unremarkable. No renal, ureteral or bladder calculi or mass. Stomach/Bowel: The stomach, duodenum, small bowel and colon are grossly normal without oral contrast. No inflammatory changes, mass lesions or obstructive findings. The terminal ileum and appendix are normal. Vascular/Lymphatic: No significant vascular findings are present. No enlarged abdominal or pelvic lymph nodes. Reproductive: The uterus and ovaries are unremarkable. Other: No pelvic mass or adenopathy. Free pelvic fluid is noted. This is likely physiologic. No inguinal mass or adenopathy. No abdominal wall hernia or subcutaneous lesions. Musculoskeletal: No significant bony findings. IMPRESSION: No acute abdominal/ pelvic findings, mass lesions or adenopathy. No renal, ureteral or bladder calculi or mass. Small amount of free pelvic fluid is likely  physiologic. A ruptured ovarian cysts is also possible. Electronically Signed   By: Marijo Sanes M.D.   On: 02/11/2016 16:40    Procedures Procedures (including critical care time)  Medications Ordered in ED Medications  ketorolac (TORADOL) 30 MG/ML injection 30 mg (30 mg Intravenous Given 02/11/16 1648)  metoCLOPramide (REGLAN) injection 10 mg (10 mg Intravenous Given 02/11/16 1648)     Initial Impression / Assessment and Plan / ED Course  I have reviewed the triage vital signs and the nursing notes.  Pertinent labs & imaging results that were available during my care of the patient were reviewed by me and considered in my medical decision making (see chart for details).  Clinical Course    20 year old female here with right flank pain, nausea, and vomiting. She is afebrile and nontoxic here. She does appear uncomfortable. Right CVA tenderness noted on exam. Lab work with leukocytosis at 15.6. Renal function is preserved. UA appears infectious  with large blood. CT renal study obtained given her symptoms, no acute stone or evidence of pyelonephritis noted. Her appendix is normal. Patient was treated here with Reglan and Toradol with improvement of her symptoms. She's not had any active emesis here in the ED. She is tolerating oral fluids well. Suspect her leukocytosis is secondary to UTI. Will treat with Keflex and Pyridium. She is encouraged follow-up with her primary care doctor.  Discussed plan with patient, she acknowledged understanding and agreed with plan of care.  Return precautions given for new or worsening symptoms.  Final Clinical Impressions(s) / ED Diagnoses   Final diagnoses:  Flank pain  Non-intractable vomiting with nausea, unspecified vomiting type  Acute cystitis without hematuria    New Prescriptions Discharge Medication List as of 02/11/2016  6:01 PM    START taking these medications   Details  cephALEXin (KEFLEX) 500 MG capsule Take 1 capsule (500 mg total) by mouth 2 (two) times daily., Starting Wed 02/11/2016, Print    phenazopyridine (PYRIDIUM) 200 MG tablet Take 1 tablet (200 mg total) by mouth 3 (three) times daily as needed for pain., Starting Wed 02/11/2016, Print         Larene Pickett, PA-C 02/11/16 1856    Courteney Julio Alm, MD 02/13/16 1347

## 2016-02-11 NOTE — Discharge Instructions (Signed)
Take the prescribed medication as directed. °Follow-up with your primary care doctor. °Return to the ED for new or worsening symptoms. °

## 2016-02-11 NOTE — ED Notes (Signed)
Pt transported to CT ?

## 2016-02-15 ENCOUNTER — Emergency Department (HOSPITAL_BASED_OUTPATIENT_CLINIC_OR_DEPARTMENT_OTHER): Payer: BLUE CROSS/BLUE SHIELD

## 2016-02-15 ENCOUNTER — Encounter (HOSPITAL_BASED_OUTPATIENT_CLINIC_OR_DEPARTMENT_OTHER): Payer: Self-pay | Admitting: Emergency Medicine

## 2016-02-15 ENCOUNTER — Inpatient Hospital Stay (HOSPITAL_BASED_OUTPATIENT_CLINIC_OR_DEPARTMENT_OTHER)
Admission: EM | Admit: 2016-02-15 | Discharge: 2016-02-18 | DRG: 690 | Disposition: A | Discharge status: Home / Self Care | Payer: BLUE CROSS/BLUE SHIELD | Attending: Family Medicine | Admitting: Family Medicine

## 2016-02-15 DIAGNOSIS — R1011 Right upper quadrant pain: Secondary | ICD-10-CM

## 2016-02-15 DIAGNOSIS — J45909 Unspecified asthma, uncomplicated: Secondary | ICD-10-CM | POA: Diagnosis present

## 2016-02-15 DIAGNOSIS — Z882 Allergy status to sulfonamides status: Secondary | ICD-10-CM

## 2016-02-15 DIAGNOSIS — R112 Nausea with vomiting, unspecified: Secondary | ICD-10-CM | POA: Diagnosis present

## 2016-02-15 DIAGNOSIS — N12 Tubulo-interstitial nephritis, not specified as acute or chronic: Principal | ICD-10-CM | POA: Diagnosis present

## 2016-02-15 DIAGNOSIS — Z79899 Other long term (current) drug therapy: Secondary | ICD-10-CM

## 2016-02-15 DIAGNOSIS — M797 Fibromyalgia: Secondary | ICD-10-CM | POA: Diagnosis present

## 2016-02-15 DIAGNOSIS — F329 Major depressive disorder, single episode, unspecified: Secondary | ICD-10-CM | POA: Diagnosis present

## 2016-02-15 DIAGNOSIS — D72829 Elevated white blood cell count, unspecified: Secondary | ICD-10-CM

## 2016-02-15 DIAGNOSIS — I498 Other specified cardiac arrhythmias: Secondary | ICD-10-CM | POA: Diagnosis present

## 2016-02-15 DIAGNOSIS — Z888 Allergy status to other drugs, medicaments and biological substances status: Secondary | ICD-10-CM

## 2016-02-15 LAB — CBC WITH DIFFERENTIAL/PLATELET
Basophils Absolute: 0.1 10*3/uL (ref 0.0–0.1)
Basophils Relative: 0 %
Eosinophils Absolute: 0.3 10*3/uL (ref 0.0–0.7)
Eosinophils Relative: 2 %
HCT: 37.3 % (ref 36.0–46.0)
Hemoglobin: 12.5 g/dL (ref 12.0–15.0)
Lymphocytes Relative: 26 %
Lymphs Abs: 4.7 10*3/uL — ABNORMAL HIGH (ref 0.7–4.0)
MCH: 28.8 pg (ref 26.0–34.0)
MCHC: 33.5 g/dL (ref 30.0–36.0)
MCV: 85.9 fL (ref 78.0–100.0)
Monocytes Absolute: 1.2 10*3/uL — ABNORMAL HIGH (ref 0.1–1.0)
Monocytes Relative: 7 %
Neutro Abs: 11.6 10*3/uL — ABNORMAL HIGH (ref 1.7–7.7)
Neutrophils Relative %: 65 %
Platelets: 477 10*3/uL — ABNORMAL HIGH (ref 150–400)
RBC: 4.34 MIL/uL (ref 3.87–5.11)
RDW: 12.7 % (ref 11.5–15.5)
WBC: 17.9 10*3/uL — ABNORMAL HIGH (ref 4.0–10.5)

## 2016-02-15 LAB — URINALYSIS, ROUTINE W REFLEX MICROSCOPIC
Bilirubin Urine: NEGATIVE
Glucose, UA: NEGATIVE mg/dL
Hgb urine dipstick: NEGATIVE
Ketones, ur: NEGATIVE mg/dL
Leukocytes, UA: NEGATIVE
Nitrite: POSITIVE — AB
Protein, ur: NEGATIVE mg/dL
Specific Gravity, Urine: 1.015 (ref 1.005–1.030)
pH: 7 (ref 5.0–8.0)

## 2016-02-15 LAB — COMPREHENSIVE METABOLIC PANEL
ALT: 12 U/L — ABNORMAL LOW (ref 14–54)
AST: 19 U/L (ref 15–41)
Albumin: 3.8 g/dL (ref 3.5–5.0)
Alkaline Phosphatase: 97 U/L (ref 38–126)
Anion gap: 8 (ref 5–15)
BUN: 7 mg/dL (ref 6–20)
CO2: 23 mmol/L (ref 22–32)
Calcium: 9.2 mg/dL (ref 8.9–10.3)
Chloride: 106 mmol/L (ref 101–111)
Creatinine, Ser: 0.6 mg/dL (ref 0.44–1.00)
GFR calc Af Amer: >60 mL/min (ref >60)
GFR calc non Af Amer: >60 mL/min (ref >60)
Glucose, Bld: 117 mg/dL — ABNORMAL HIGH (ref 65–99)
Potassium: 3.5 mmol/L (ref 3.5–5.1)
Sodium: 137 mmol/L (ref 135–145)
Total Bilirubin: 0.3 mg/dL (ref 0.3–1.2)
Total Protein: 7.3 g/dL (ref 6.5–8.1)

## 2016-02-15 LAB — LIPASE, BLOOD: Lipase: 19 U/L (ref 11–51)

## 2016-02-15 LAB — URINE MICROSCOPIC-ADD ON

## 2016-02-15 LAB — PREGNANCY, URINE: Preg Test, Ur: NEGATIVE

## 2016-02-15 LAB — I-STAT CG4 LACTIC ACID, ED: Lactic Acid, Venous: 1.1 mmol/L (ref 0.5–1.9)

## 2016-02-15 MED ORDER — SODIUM CHLORIDE 0.9 % IV BOLUS (SEPSIS)
1000.0000 mL | Freq: Once | INTRAVENOUS | Status: AC
  Administered 2016-02-15: 1000 mL via INTRAVENOUS

## 2016-02-15 MED ORDER — METOCLOPRAMIDE HCL 5 MG/ML IJ SOLN
10.0000 mg | Freq: Once | INTRAMUSCULAR | Status: AC
  Administered 2016-02-15: 10 mg via INTRAVENOUS
  Filled 2016-02-15: qty 2

## 2016-02-15 MED ORDER — DEXTROSE 5 % IV SOLN
1.0000 g | Freq: Once | INTRAVENOUS | Status: AC
  Administered 2016-02-15: 1 g via INTRAVENOUS
  Filled 2016-02-15: qty 10

## 2016-02-15 MED ORDER — KETOROLAC TROMETHAMINE 15 MG/ML IJ SOLN
15.0000 mg | Freq: Once | INTRAMUSCULAR | Status: AC
  Administered 2016-02-15: 15 mg via INTRAVENOUS
  Filled 2016-02-15: qty 1

## 2016-02-15 NOTE — ED Provider Notes (Signed)
[facility] DEPT [facility] Provider Note   CSN: [id number] Arrival date & time: 02/15/16  1940  By signing my name below, I, Amber Tyler, attest that this documentation has been prepared under the direction and in the presence of Gareth Morgan, MD.  Electronically Signed: Julien Nordmann, ED Scribe. 02/15/16. 8:36 PM.    History   Chief Complaint Chief Complaint  Patient presents with  . Abdominal Pain  . Shortness of Breath   The history is provided by the patient. No language interpreter was used.   HPI Comments: Amber Tyler is a 20 y.o. female who has a PMhx of asthma, fibromyalgia, and thrombocytosis presents to the Emergency Department complaining of recurrent, waxing and waning, gradual worsening, right upper flank pain x 6 days. Associated nausea, vomiting x 3. She is unable to keep any food in her system. She says she is having intermittent "spasms" in her upper flank region. She says the pain takes "breath away". Pt was seen on 11/15 for the same symptoms where she was diagnosed with a UTI and kidney infection. Pt was prescribed Keflex and Pyridium. She has not been taking any medication to alleviate her pain. Pt does not have a hx of kidney stones. Denies fever, shortness of breath, or cough.  Patient reports she has been vomiting up antibiotics whenever she tries to take them.    PCP: Nicholes Rough, PA-C  Past Medical History:  Diagnosis Date  . Asthma   . Fibromyalgia   . POTS (postural orthostatic tachycardia syndrome)     Patient Active Problem List   Diagnosis Date Noted  . Pyelonephritis 02/15/2016  . 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 Awareness of heartbeats 12/01/2014  . Thrombocytosis ([place]) 11/19/2013  . Other malaise and fatigue 11/15/2013  . Frequent headaches 04/05/2013  . Dysmenorrhea 05/05/2012  . Concussion 02/29/2012  . Bipolar disorder ([place]) 11/17/2011  . Overdose of antidepressant 11/16/2011  . Asthma 07/21/2010    Past Surgical History:    Procedure Laterality Date  . ADENOIDECTOMY    . ANKLE SURGERY Right   . internal heart monitor    . KNEE SURGERY    . TONSILLECTOMY      OB History    No data available       Home Medications    Prior to Admission medications   Medication Sig Start Date End Date Taking? Authorizing Provider  buPROPion (WELLBUTRIN XL) 150 MG 24 hr tablet Take by mouth. 04/30/15 04/29/16  Historical Provider, MD  cephALEXin (KEFLEX) 500 MG capsule Take 1 capsule (500 mg total) by mouth 2 (two) times daily. 02/11/16   Larene Pickett, PA-C  DULoxetine (CYMBALTA) 60 MG capsule Take 60 mg by mouth daily.    Historical Provider, MD  hydrOXYzine (ATARAX/VISTARIL) 25 MG tablet Take 1 tablet (25 mg total) by mouth every 6 (six) hours. 10/13/13   Kristen N Ward, DO  metoprolol succinate (TOPROL XL) 25 MG 24 hr tablet Take by mouth. 02/06/15   Historical Provider, MD  phenazopyridine (PYRIDIUM) 200 MG tablet Take 1 tablet (200 mg total) by mouth 3 (three) times daily as needed for pain. 02/11/16   Larene Pickett, PA-C    Family History No family history on file.  Social History Social History  Substance Use Topics  . Smoking status: Never Smoker  . Smokeless tobacco: Never Used  . Alcohol use Yes     Comment: occ     Allergies   Ondansetron hcl; Sulfonamide derivatives; and Tape   Review of  Systems Review of Systems  Constitutional: Negative for fever.  HENT: Negative for sore throat.   Eyes: Negative for visual disturbance.  Respiratory: Negative for cough and shortness of breath.   Cardiovascular: Negative for chest pain.  Gastrointestinal: Positive for abdominal pain, nausea and vomiting.  Genitourinary: Positive for flank pain. Negative for difficulty urinating.  Musculoskeletal: Negative for back pain and neck pain.  Skin: Negative for rash.  Neurological: Negative for syncope and headaches.     Physical Exam Updated Vital Signs BP 103/66   Pulse 92   Temp 98.2 F (36.8 C) (Oral)    Resp 20   LMP 01/28/2016   SpO2 97%   Physical Exam  Constitutional: She is oriented to person, place, and time. She appears well-developed and well-nourished. She appears distressed (pain).  Appears uncomfortable  HENT:  Head: Normocephalic and atraumatic.  Eyes: Conjunctivae and EOM are normal.  Neck: Normal range of motion.  Cardiovascular: Normal rate, regular rhythm, normal heart sounds and intact distal pulses.  Exam reveals no gallop and no friction rub.   No murmur heard. Pulmonary/Chest: Effort normal and breath sounds normal. No respiratory distress. She has no wheezes. She has no rales.  Abdominal: Soft. She exhibits no distension. There is tenderness. There is no guarding, no tenderness at McBurney's point and negative Murphy's sign.  right CVA tenderness, RUQ tenderness  Musculoskeletal: Normal range of motion. She exhibits no edema or tenderness.  Neurological: She is alert and oriented to person, place, and time.  Skin: Skin is warm and dry. No rash noted. She is not diaphoretic. No erythema.  Psychiatric: She has a normal mood and affect.  Nursing note and vitals reviewed.    ED Treatments / Results  DIAGNOSTIC STUDIES: Oxygen Saturation is 100% on RA, normal by my interpretation.  COORDINATION OF CARE:  8:34 PM Discussed treatment plan with pt at bedside and pt agreed to plan.  Labs (all labs ordered are listed, but only abnormal results are displayed) Labs Reviewed  CBC WITH DIFFERENTIAL/PLATELET - Abnormal; Notable for the following:       Result Value   WBC 17.9 (*)    Platelets 477 (*)    Neutro Abs 11.6 (*)    Lymphs Abs 4.7 (*)    Monocytes Absolute 1.2 (*)    All other components within normal limits  COMPREHENSIVE METABOLIC PANEL - Abnormal; Notable for the following:    Glucose, Bld 117 (*)    ALT 12 (*)    All other components within normal limits  URINALYSIS, ROUTINE W REFLEX MICROSCOPIC (NOT AT [facility]) - Abnormal; Notable for the following:      Color, Urine ORANGE (*)    Nitrite POSITIVE (*)    All other components within normal limits  URINE MICROSCOPIC-ADD ON - Abnormal; Notable for the following:    Squamous Epithelial / LPF 0-5 (*)    Bacteria, UA FEW (*)    All other components within normal limits  URINE CULTURE  LIPASE, BLOOD  PREGNANCY, URINE  I-STAT CG4 LACTIC ACID, ED  I-STAT CG4 LACTIC ACID, ED    EKG  EKG Interpretation None       Radiology Dg Chest 2 View  Result Date: 02/15/2016 CLINICAL DATA:  Shortness of breath and upper abdominal pain. EXAM: CHEST  2 VIEW COMPARISON:  PA and lateral chest 01/13/2016 and 11/29/2014. FINDINGS: Lungs are clear. Heart size is normal. No pneumothorax or pleural effusion. Loop recorder is noted. IMPRESSION: No acute disease. Electronically Signed  By: Inge Rise M.D.   On: 02/15/2016 20:28    Procedures Procedures (including critical care time)  Medications Ordered in ED Medications  sodium chloride 0.9 % bolus 1,000 mL (0 mLs Intravenous Stopped 02/15/16 2248)  metoCLOPramide (REGLAN) injection 10 mg (10 mg Intravenous Given 02/15/16 2117)  cefTRIAXone (ROCEPHIN) 1 g in dextrose 5 % 50 mL IVPB (0 g Intravenous Stopped 02/15/16 2208)  ketorolac (TORADOL) 15 MG/ML injection 15 mg (15 mg Intravenous Given 02/15/16 2117)  sodium chloride 0.9 % bolus 1,000 mL (0 mLs Intravenous Stopped 02/15/16 2327)  ketorolac (TORADOL) 15 MG/ML injection 15 mg (15 mg Intravenous Given 02/15/16 2323)     Initial Impression / Assessment and Plan / ED Course  I have reviewed the triage vital signs and the nursing notes.  Pertinent labs & imaging results that were available during my care of the patient were reviewed by me and considered in my medical decision making (see chart for details).  Clinical Course    20 year old female with recent diagnosis of pyelonephritis 4 days ago in the emergency department presents with concern for continued nausea, vomiting and right flank  pain. Patient appears very uncomfortable on exam, however have low suspicion for development of nephrolithiasis given normal CT done days ago. Given patient afebrile and improving with Toradol, have overall low suspicion for development of perinephric abscess and do not feel repeat CT indicated at this time. Doubt appendicitis/cholecystitis by exam, recent neg CT.  WBC stable at 18000.  Patient's urinalysis shows improvement of UTI, however she continues to be symptomatic, and reports she is unable to keep down her antibiotics at home. Her pain is improved with Toradol, and nausea improved with Reglan in the emergency department, however patient concerned pain is returning and given continuing symptoms would prefer observation for antibiotics, fluids, pain and nausea control. Pt accepted by Dr. Blaine Hamper for pyelonephritis observation.   I personally performed the services described in this documentation, which was scribed in my presence. The recorded information has been reviewed and is accurate.  Final Clinical Impressions(s) / ED Diagnoses   Final diagnoses:  Pyelonephritis    New Prescriptions New Prescriptions   No medications on file     Gareth Morgan, MD 02/16/16 [phone]

## 2016-02-15 NOTE — ED Notes (Signed)
Per [facility] @ 11:47 pm - Truck should be ready to head this way in about 15 minutes.

## 2016-02-15 NOTE — ED Triage Notes (Signed)
Pt seen in ED on weds for same.  Sent home with antibx Rx.  Pt states pain has since worsened and is now accompanied by SOB when she feels her right side "spasm".

## 2016-02-15 NOTE — ED Notes (Signed)
Paged Hospitalist via [place] @ 10:17 pm

## 2016-02-15 NOTE — ED Notes (Addendum)
Pt reports pain really bad at 6:00pm tonight.  Pt is nauseated and vomiting x 3.  Pt denies diarrhea.  Pt is unable to eat and drink.   Pt is not SOB.  Pt reports she holds her breath when she has a spasm.

## 2016-02-15 NOTE — Progress Notes (Signed)
This is a no charge note  Transfer from [facility] per Dr. Billy Fischer  20 year old lady with past medical history of asthma, depression, fibromyalgia, thrombocytosis, who presents with right flank pain for 6 days, associated with nausea and vomiting. She was seen in ED on 11/15, had positive urinalysis for UTI and negative CT-renal stone protocol. She was d/c'ed home with Keflex for UTI. She continues to have right flank pain, nausea and vomiting and comes back to ED.   WBC 17.9, urinalysis negative for leukocyte, but positive for nitrates, lactate 1.1, lipase 19, negative pregnancy test, electrolytes and renal function okay, negative chest x-ray. Temperature normal, tachycardia, O2 saturation 96% on room air. Patient possibly has pyelonephritis. IV Rocephin was started. Patient is accepted to telemetry bed for observation.  Ivor Costa, MD  [facility] Pager [phone]  If 7PM-7AM, please contact night-coverage [email or web address] Password [email or web address] 02/15/2016, 10:56 PM

## 2016-02-16 ENCOUNTER — Encounter (HOSPITAL_COMMUNITY): Payer: Self-pay | Admitting: *Deleted

## 2016-02-16 DIAGNOSIS — D72829 Elevated white blood cell count, unspecified: Secondary | ICD-10-CM | POA: Diagnosis not present

## 2016-02-16 DIAGNOSIS — R1011 Right upper quadrant pain: Secondary | ICD-10-CM | POA: Diagnosis not present

## 2016-02-16 DIAGNOSIS — I498 Other specified cardiac arrhythmias: Secondary | ICD-10-CM | POA: Diagnosis present

## 2016-02-16 DIAGNOSIS — Z888 Allergy status to other drugs, medicaments and biological substances status: Secondary | ICD-10-CM | POA: Diagnosis not present

## 2016-02-16 DIAGNOSIS — Z79899 Other long term (current) drug therapy: Secondary | ICD-10-CM | POA: Diagnosis not present

## 2016-02-16 DIAGNOSIS — N12 Tubulo-interstitial nephritis, not specified as acute or chronic: Secondary | ICD-10-CM | POA: Diagnosis present

## 2016-02-16 DIAGNOSIS — R112 Nausea with vomiting, unspecified: Secondary | ICD-10-CM | POA: Diagnosis present

## 2016-02-16 DIAGNOSIS — J45909 Unspecified asthma, uncomplicated: Secondary | ICD-10-CM | POA: Diagnosis present

## 2016-02-16 DIAGNOSIS — N10 Acute tubulo-interstitial nephritis: Secondary | ICD-10-CM | POA: Diagnosis not present

## 2016-02-16 DIAGNOSIS — F329 Major depressive disorder, single episode, unspecified: Secondary | ICD-10-CM | POA: Diagnosis present

## 2016-02-16 DIAGNOSIS — M797 Fibromyalgia: Secondary | ICD-10-CM | POA: Diagnosis present

## 2016-02-16 DIAGNOSIS — Z882 Allergy status to sulfonamides status: Secondary | ICD-10-CM | POA: Diagnosis not present

## 2016-02-16 LAB — COMPREHENSIVE METABOLIC PANEL
ALT: 9 U/L — ABNORMAL LOW (ref 14–54)
AST: 14 U/L — ABNORMAL LOW (ref 15–41)
Albumin: 2.7 g/dL — ABNORMAL LOW (ref 3.5–5.0)
Alkaline Phosphatase: 82 U/L (ref 38–126)
Anion gap: 5 (ref 5–15)
BUN: 5 mg/dL — ABNORMAL LOW (ref 6–20)
CO2: 24 mmol/L (ref 22–32)
Calcium: 8.3 mg/dL — ABNORMAL LOW (ref 8.9–10.3)
Chloride: 110 mmol/L (ref 101–111)
Creatinine, Ser: 0.78 mg/dL (ref 0.44–1.00)
GFR calc Af Amer: >60 mL/min (ref >60)
GFR calc non Af Amer: >60 mL/min (ref >60)
Glucose, Bld: 95 mg/dL (ref 65–99)
Potassium: 3.8 mmol/L (ref 3.5–5.1)
Sodium: 139 mmol/L (ref 135–145)
Total Bilirubin: 0.3 mg/dL (ref 0.3–1.2)
Total Protein: 5.4 g/dL — ABNORMAL LOW (ref 6.5–8.1)

## 2016-02-16 LAB — CBC WITH DIFFERENTIAL/PLATELET
Basophils Absolute: 0.1 10*3/uL (ref 0.0–0.1)
Basophils Relative: 1 %
Eosinophils Absolute: 0.3 10*3/uL (ref 0.0–0.7)
Eosinophils Relative: 2 %
HCT: 34.2 % — ABNORMAL LOW (ref 36.0–46.0)
Hemoglobin: 11.1 g/dL — ABNORMAL LOW (ref 12.0–15.0)
Lymphocytes Relative: 41 %
Lymphs Abs: 5.1 10*3/uL — ABNORMAL HIGH (ref 0.7–4.0)
MCH: 28.1 pg (ref 26.0–34.0)
MCHC: 32.5 g/dL (ref 30.0–36.0)
MCV: 86.6 fL (ref 78.0–100.0)
Monocytes Absolute: 1 10*3/uL (ref 0.1–1.0)
Monocytes Relative: 8 %
Neutro Abs: 6 10*3/uL (ref 1.7–7.7)
Neutrophils Relative %: 48 %
Platelets: 351 10*3/uL (ref 150–400)
RBC: 3.95 MIL/uL (ref 3.87–5.11)
RDW: 12.9 % (ref 11.5–15.5)
WBC: 12.4 10*3/uL — ABNORMAL HIGH (ref 4.0–10.5)

## 2016-02-16 MED ORDER — DEXTROSE 5 % IV SOLN: 1.0000 g | INTRAVENOUS | Status: DC

## 2016-02-16 MED ORDER — PROCHLORPERAZINE EDISYLATE 5 MG/ML IJ SOLN
5.0000 mg | Freq: Four times a day (QID) | INTRAMUSCULAR | Status: DC | PRN
  Administered 2016-02-16 – 2016-02-17 (×3): 5 mg via INTRAVENOUS
  Filled 2016-02-16 (×4): qty 2

## 2016-02-16 MED ORDER — DULOXETINE HCL 60 MG PO CPEP
60.0000 mg | ORAL_CAPSULE | Freq: Every day | ORAL | Status: DC
  Administered 2016-02-16 – 2016-02-18 (×3): 60 mg via ORAL
  Filled 2016-02-16 (×3): qty 1

## 2016-02-16 MED ORDER — DEXTROSE 5 % IV SOLN
1.0000 g | INTRAVENOUS | Status: DC
  Administered 2016-02-16: 1 g via INTRAVENOUS
  Filled 2016-02-16 (×2): qty 10

## 2016-02-16 MED ORDER — SODIUM CHLORIDE 0.9 % IV SOLN
INTRAVENOUS | Status: AC
  Administered 2016-02-16 (×2): via INTRAVENOUS

## 2016-02-16 MED ORDER — ACETAMINOPHEN 325 MG PO TABS
650.0000 mg | ORAL_TABLET | Freq: Four times a day (QID) | ORAL | Status: DC | PRN
  Administered 2016-02-16: 650 mg via ORAL
  Filled 2016-02-16: qty 2

## 2016-02-16 MED ORDER — HYDROCODONE-ACETAMINOPHEN 5-325 MG PO TABS
1.0000 | ORAL_TABLET | ORAL | Status: DC | PRN
  Administered 2016-02-16 – 2016-02-18 (×8): 1 via ORAL
  Filled 2016-02-16 (×8): qty 1

## 2016-02-16 MED ORDER — ACETAMINOPHEN 650 MG RE SUPP: 650.0000 mg | Freq: Four times a day (QID) | RECTAL | Status: DC | PRN

## 2016-02-16 NOTE — Progress Notes (Signed)
Ms Beermann is a patient of the [facility] inadvertantly admitted to the hospitalist service.  We are assuming care.  While there are a few twists in the story, I think it will turn out to be right sided pyelo which failed outpatient treatment.  She was receiving cephalothin twice a day (UTI dose) rather than four times per day (pyelo dose.)

## 2016-02-16 NOTE — H&P (Signed)
History and Physical    Amber Tyler [id number] DOB: [birth year] DOA: 02/15/2016  PCP: Nicholes Rough, PA-C  Patient coming from: Home.  Chief Complaint: Nausea vomiting and right flank pain.  HPI: Amber Tyler is a 20 y.o. female with history of depression and POTTS. Presents to the ER because of persistent nausea vomiting with right flank pain. Patient presented to the ER 5 days ago with similar complaints and at that time UA showed features consistent with UTI and CT scan renal stone study was negative for anything acute. Patient was discharged home on antibiotics for pyelonephritis. Despite taking which patient was still having persistent nausea vomiting. On exam patient has right flank tenderness. Urine pregnancy is negative. Patient is being admitted for IV antibiotics and further observation since patient is not able to take in orally.   ED Course: IV fluid bolus was given along with ceftriaxone.  Review of Systems: As per HPI, rest all negative.   Past Medical History:  Diagnosis Date  . Asthma   . Fibromyalgia   . POTS (postural orthostatic tachycardia syndrome)     Past Surgical History:  Procedure Laterality Date  . ADENOIDECTOMY    . ANKLE SURGERY Right   . internal heart monitor    . KNEE SURGERY    . TONSILLECTOMY       reports that she has never smoked. She has never used smokeless tobacco. She reports that she drinks alcohol. She reports that she does not use drugs.  Allergies  Allergen Reactions  . Ondansetron Hcl Hives    unknown  . Sulfonamide Derivatives Shortness Of Breath    SOB and Hives  . Tape Rash    Peels skin off    Family History  Problem Relation Age of Onset  . Diabetes Neg Hx     Prior to Admission medications   Medication Sig Start Date End Date Taking? Authorizing Provider  buPROPion (WELLBUTRIN XL) 150 MG 24 hr tablet Take by mouth. 04/30/15 04/29/16  Historical Provider, MD  cephALEXin (KEFLEX) 500 MG capsule Take 1 capsule (500  mg total) by mouth 2 (two) times daily. 02/11/16   Larene Pickett, PA-C  DULoxetine (CYMBALTA) 60 MG capsule Take 60 mg by mouth daily.    Historical Provider, MD  hydrOXYzine (ATARAX/VISTARIL) 25 MG tablet Take 1 tablet (25 mg total) by mouth every 6 (six) hours. 10/13/13   Kristen N Ward, DO  metoprolol succinate (TOPROL XL) 25 MG 24 hr tablet Take by mouth. 02/06/15   Historical Provider, MD  phenazopyridine (PYRIDIUM) 200 MG tablet Take 1 tablet (200 mg total) by mouth 3 (three) times daily as needed for pain. 02/11/16   Larene Pickett, PA-C    Physical Exam: Vitals:   02/15/16 2230 02/16/16 0000 02/16/16 0015 02/16/16 0116  BP: 107/70 106/67 103/66 111/71  Pulse: 98 91 92 94  Resp:  19 20 19   Temp:    98.3 F (36.8 C)  TempSrc:    Oral  SpO2: 99% 97% 97% 98%  Weight:    76.3 kg (168 lb 3.4 oz)  Height:    5\' 5"  (1.651 m)      Constitutional: Moderately built and well-nourished. Vitals:   02/15/16 2230 02/16/16 0000 02/16/16 0015 02/16/16 0116  BP: 107/70 106/67 103/66 111/71  Pulse: 98 91 92 94  Resp:  19 20 19   Temp:    98.3 F (36.8 C)  TempSrc:    Oral  SpO2: 99% 97% 97% 98%  Weight:  76.3 kg (168 lb 3.4 oz)  Height:    5\' 5"  (1.651 m)   Eyes: Anicteric no pallor. ENMT: No discharge from the ears eyes nose or mouth. Neck: No mass felt. No JVD appreciated. Respiratory: No rhonchi or crepitations. Cardiovascular: S1-S2 heard. No murmurs appreciated. Abdomen: Right flank tenderness no guarding or rigidity. Bowel sounds present. Musculoskeletal: No edema. No joint effusion. Skin: No rash. Skin appears warm. Neurologic: Alert awake oriented to time place and person. Moves all extremities. Psychiatric: Appears normal. Normal affect.   Labs on Admission: I have personally reviewed following labs and imaging studies  CBC:  Recent Labs Lab 02/11/16 1642 02/15/16 2049  WBC 18.6* 17.9*  NEUTROABS 14.9* 11.6*  HGB 13.5 12.5  HCT 40.3 37.3  MCV 86.5 85.9  PLT  444* [id number]*   Basic Metabolic Panel:  Recent Labs Lab 02/11/16 1642 02/15/16 2049  NA 137 137  K 3.5 3.5  CL 104 106  CO2 25 23  GLUCOSE 112* 117*  BUN 10 7  CREATININE 0.78 0.60  CALCIUM 9.6 9.2   GFR: Estimated Creatinine Clearance: 112.7 mL/min (by C-G formula based on SCr of 0.6 mg/dL). Liver Function Tests:  Recent Labs Lab 02/15/16 2049  AST 19  ALT 12*  ALKPHOS 97  BILITOT 0.3  PROT 7.3  ALBUMIN 3.8    Recent Labs Lab 02/15/16 2049  LIPASE 19   No results for input(s): AMMONIA in the last 168 hours. Coagulation Profile: No results for input(s): INR, PROTIME in the last 168 hours. Cardiac Enzymes: No results for input(s): CKTOTAL, CKMB, CKMBINDEX, TROPONINI in the last 168 hours. BNP (last 3 results) No results for input(s): PROBNP in the last 8760 hours. HbA1C: No results for input(s): HGBA1C in the last 72 hours. CBG: No results for input(s): GLUCAP in the last 168 hours. Lipid Profile: No results for input(s): CHOL, HDL, LDLCALC, TRIG, CHOLHDL, LDLDIRECT in the last 72 hours. Thyroid Function Tests: No results for input(s): TSH, T4TOTAL, FREET4, T3FREE, THYROIDAB in the last 72 hours. Anemia Panel: No results for input(s): VITAMINB12, FOLATE, FERRITIN, TIBC, IRON, RETICCTPCT in the last 72 hours. Urine analysis:    Component Value Date/Time   COLORURINE ORANGE (A) 02/15/2016 2041   APPEARANCEUR CLEAR 02/15/2016 2041   LABSPEC 1.015 02/15/2016 2041   PHURINE 7.0 02/15/2016 2041   GLUCOSEU NEGATIVE 02/15/2016 2041   HGBUR NEGATIVE 02/15/2016 2041   BILIRUBINUR NEGATIVE 02/15/2016 2041   KETONESUR NEGATIVE 02/15/2016 2041   PROTEINUR NEGATIVE 02/15/2016 2041   UROBILINOGEN 1.0 06/15/2014 2022   NITRITE POSITIVE (A) 02/15/2016 2041   LEUKOCYTESUR NEGATIVE 02/15/2016 2041   Sepsis Labs: @LABRCNTIP (procalcitonin:4,lacticidven:4) )No results found for this or any previous visit (from the past 240 hour(s)).   Radiological Exams on  Admission: Dg Chest 2 View  Result Date: 02/15/2016 CLINICAL DATA:  Shortness of breath and upper abdominal pain. EXAM: CHEST  2 VIEW COMPARISON:  PA and lateral chest 01/13/2016 and 11/29/2014. FINDINGS: Lungs are clear. Heart size is normal. No pneumothorax or pleural effusion. Loop recorder is noted. IMPRESSION: No acute disease. Electronically Signed   By: Inge Rise M.D.   On: 02/15/2016 20:28    Assessment/Plan Principal Problem:   Nausea & vomiting Active Problems:   Pyelonephritis    1. Intractable nausea and vomiting from pyelonephritis - patient is placed on ceftriaxone. Follow urine cultures. For now I placed patient on clear liquid diet. If patient can tolerate slowly advance diet. If vomiting persist will check sonogram of the abdomen.  2. Depression on Cymbalta.   DVT prophylaxis: SCDs. Code Status: Full code.  Family Communication: Discussed with patient.  Disposition Plan: Home.  Consults called: None.  Admission status: Observation.    Rise Patience MD [facility] Pager [phone].  If 7PM-7AM, please contact night-coverage [email or web address] Password [email or web address]  02/16/2016, 3:11 AM

## 2016-02-16 NOTE — Progress Notes (Signed)
New Admission Note:  Arrival Method: EMS stretcher Mental Orientation: Alert and oriened x 4 Telemetry:  BOX 22  Assessment: Completed Skin: Warm and dry. Tattoos  IV: NSL Pain: 4/10 flank  Tubes: N/A Safety Measures: Safety Fall Prevention Plan was given, discussed and signed. Admission: Completed [address] Orientation: Patient has been orientated to the room, unit and the staff. Family: NOne   Orders have been reviewed and implemented. Will continue to monitor the patient. Call light has been placed within reach and bed alarm has been activated.   Sima Matas BSN, RN  Phone Number: [phone]

## 2016-02-16 NOTE — Progress Notes (Signed)
PROGRESS NOTE  [name]  [id number] DOB: Jan 08, 1996 DOA: 02/15/2016 PCP: Nicholes Rough, PA-C Outpatient Specialists:  Subjective: Less nausea this morning, still has right flank pain.  Brief Narrative:  Amber Tyler is a 20 y.o. female with history of depression and POTTS. Presents to the ER because of persistent nausea vomiting with right flank pain. Patient presented to the ER 5 days ago with similar complaints and at that time UA showed features consistent with UTI and CT scan renal stone study was negative for anything acute. Patient was discharged home on antibiotics for pyelonephritis. Despite taking which patient was still having persistent nausea vomiting. On exam patient has right flank tenderness. Urine pregnancy is negative. Patient is being admitted for IV antibiotics and further observation since patient is not able to take in orally.   Assessment & Plan:   Principal Problem:   Nausea & vomiting Active Problems:   Pyelonephritis   This is a no charge note, patient seen earlier today by my colleague Dr. Merrilyn Puma. Recently sent home with UTI and early pyelonephritis, came back to the hospital with nausea, vomiting. Continue IV antibiotics and IV fluids.   DVT prophylaxis:  Code Status: Full Code Family Communication:  Disposition Plan:  Diet: Diet regular Room service appropriate? Yes; Fluid consistency: Thin  Consultants:   None  Procedures:   None  Antimicrobials:   None   Objective: Vitals:   02/16/16 0015 02/16/16 0116 02/16/16 0530 02/16/16 0800  BP: 103/66 111/71 101/63 111/63  Pulse: 92 94 86 82  Resp: 20 19 18 16   Temp:  98.3 F (36.8 C) 98.2 F (36.8 C) 98.5 F (36.9 C)  TempSrc:  Oral Oral Oral  SpO2: 97% 98% 99% 97%  Weight:  76.3 kg (168 lb 3.4 oz)    Height:  5\' 5"  (1.651 m)      Intake/Output Summary (Last 24 hours) at 02/16/16 1231 Last data filed at 02/16/16 1023  Gross per 24 hour  Intake             2598 ml  Output              1000 ml  Net             1598 ml   Filed Weights   02/16/16 0116  Weight: 76.3 kg (168 lb 3.4 oz)    Examination: General exam: Appears calm and comfortable  Respiratory system: Clear to auscultation. Respiratory effort normal. Cardiovascular system: S1 & S2 heard, RRR. No JVD, murmurs, rubs, gallops or clicks. No pedal edema. Gastrointestinal system: Abdomen is nondistended, soft and nontender. No organomegaly or masses felt. Normal bowel sounds heard. Central nervous system: Alert and oriented. No focal neurological deficits. Extremities: Symmetric 5 x 5 power. Skin: No rashes, lesions or ulcers Psychiatry: Judgement and insight appear normal. Mood & affect appropriate.   Data Reviewed: I have personally reviewed following labs and imaging studies  CBC:  Recent Labs Lab 02/11/16 1642 02/15/16 2049 02/16/16 0417  WBC 18.6* 17.9* 12.4*  NEUTROABS 14.9* 11.6* 6.0  HGB 13.5 12.5 11.1*  HCT 40.3 37.3 34.2*  MCV 86.5 85.9 86.6  PLT 444* 477* [id number]   Basic Metabolic Panel:  Recent Labs Lab 02/11/16 1642 02/15/16 2049 02/16/16 0417  NA 137 137 139  K 3.5 3.5 3.8  CL 104 106 110  CO2 25 23 24   GLUCOSE 112* 117* 95  BUN 10 7 5*  CREATININE 0.78 0.60 0.78  CALCIUM 9.6 9.2 8.3*   GFR:  Estimated Creatinine Clearance: 112.7 mL/min (by C-G formula based on SCr of 0.78 mg/dL). Liver Function Tests:  Recent Labs Lab 02/15/16 2049 02/16/16 0417  AST 19 14*  ALT 12* 9*  ALKPHOS 97 82  BILITOT 0.3 0.3  PROT 7.3 5.4*  ALBUMIN 3.8 2.7*    Recent Labs Lab 02/15/16 2049  LIPASE 19   No results for input(s): AMMONIA in the last 168 hours. Coagulation Profile: No results for input(s): INR, PROTIME in the last 168 hours. Cardiac Enzymes: No results for input(s): CKTOTAL, CKMB, CKMBINDEX, TROPONINI in the last 168 hours. BNP (last 3 results) No results for input(s): PROBNP in the last 8760 hours. HbA1C: No results for input(s): HGBA1C in the last 72  hours. CBG: No results for input(s): GLUCAP in the last 168 hours. Lipid Profile: No results for input(s): CHOL, HDL, LDLCALC, TRIG, CHOLHDL, LDLDIRECT in the last 72 hours. Thyroid Function Tests: No results for input(s): TSH, T4TOTAL, FREET4, T3FREE, THYROIDAB in the last 72 hours. Anemia Panel: No results for input(s): VITAMINB12, FOLATE, FERRITIN, TIBC, IRON, RETICCTPCT in the last 72 hours. Urine analysis:    Component Value Date/Time   COLORURINE ORANGE (A) 02/15/2016 2041   APPEARANCEUR CLEAR 02/15/2016 2041   LABSPEC 1.015 02/15/2016 2041   PHURINE 7.0 02/15/2016 2041   GLUCOSEU NEGATIVE 02/15/2016 2041   HGBUR NEGATIVE 02/15/2016 2041   BILIRUBINUR NEGATIVE 02/15/2016 2041   KETONESUR NEGATIVE 02/15/2016 2041   PROTEINUR NEGATIVE 02/15/2016 2041   UROBILINOGEN 1.0 06/15/2014 2022   NITRITE POSITIVE (A) 02/15/2016 2041   LEUKOCYTESUR NEGATIVE 02/15/2016 2041   Sepsis Labs: @LABRCNTIP (procalcitonin:4,lacticidven:4)  )No results found for this or any previous visit (from the past 240 hour(s)).   Invalid input(s): PROCALCITONIN, LACTICACIDVEN   Radiology Studies: Dg Chest 2 View  Result Date: 02/15/2016 CLINICAL DATA:  Shortness of breath and upper abdominal pain. EXAM: CHEST  2 VIEW COMPARISON:  PA and lateral chest 01/13/2016 and 11/29/2014. FINDINGS: Lungs are clear. Heart size is normal. No pneumothorax or pleural effusion. Loop recorder is noted. IMPRESSION: No acute disease. Electronically Signed   By: Inge Rise M.D.   On: 02/15/2016 20:28        Scheduled Meds: . cefTRIAXone (ROCEPHIN)  IV  1 g Intravenous Q24H  . DULoxetine  60 mg Oral Daily   Continuous Infusions: . sodium chloride 75 mL/hr at 02/16/16 0328     LOS: 0 days    Time spent: 35 minutes    Raheen Capili A, MD [facility] Pager [phone]  If 7PM-7AM, please contact night-coverage [email or web address] Password [facility] 02/16/2016, 12:31 PM

## 2016-02-16 NOTE — Progress Notes (Signed)
Pharmacy Antibiotic Note Amber Tyler is a 20 y.o. female admitted on 02/15/2016 with concern for pyelonephritis.  Pharmacy has been consulted for ceftriaxone dosing.  Plan: 1. Continue ceftriaxone 1 gram IV every 24 hours  2. Pharmacy to signoff as medication does not require renal dose adjustment; please re consult if can be of further assisstance   Vincenza Hews, PharmD, BCPS 02/16/2016, 3:18 AM Pager: [phone]

## 2016-02-16 NOTE — Progress Notes (Signed)
Report received from [facility] ER RN. Room ready.

## 2016-02-17 ENCOUNTER — Inpatient Hospital Stay (HOSPITAL_COMMUNITY): Payer: BLUE CROSS/BLUE SHIELD

## 2016-02-17 DIAGNOSIS — D72829 Elevated white blood cell count, unspecified: Secondary | ICD-10-CM

## 2016-02-17 DIAGNOSIS — N10 Acute tubulo-interstitial nephritis: Secondary | ICD-10-CM

## 2016-02-17 DIAGNOSIS — R112 Nausea with vomiting, unspecified: Secondary | ICD-10-CM

## 2016-02-17 DIAGNOSIS — R1011 Right upper quadrant pain: Secondary | ICD-10-CM

## 2016-02-17 LAB — COMPREHENSIVE METABOLIC PANEL
ALT: 11 U/L — ABNORMAL LOW (ref 14–54)
AST: 15 U/L (ref 15–41)
Albumin: 3 g/dL — ABNORMAL LOW (ref 3.5–5.0)
Alkaline Phosphatase: 84 U/L (ref 38–126)
Anion gap: 4 — ABNORMAL LOW (ref 5–15)
BUN: <5 mg/dL — ABNORMAL LOW (ref 6–20)
CO2: 25 mmol/L (ref 22–32)
Calcium: 8.9 mg/dL (ref 8.9–10.3)
Chloride: 108 mmol/L (ref 101–111)
Creatinine, Ser: 0.71 mg/dL (ref 0.44–1.00)
GFR calc Af Amer: >60 mL/min (ref >60)
GFR calc non Af Amer: >60 mL/min (ref >60)
Glucose, Bld: 87 mg/dL (ref 65–99)
Potassium: 4.1 mmol/L (ref 3.5–5.1)
Sodium: 137 mmol/L (ref 135–145)
Total Bilirubin: 0.3 mg/dL (ref 0.3–1.2)
Total Protein: 5.8 g/dL — ABNORMAL LOW (ref 6.5–8.1)

## 2016-02-17 LAB — CBC
HCT: 34.4 % — ABNORMAL LOW (ref 36.0–46.0)
Hemoglobin: 11.4 g/dL — ABNORMAL LOW (ref 12.0–15.0)
MCH: 28.9 pg (ref 26.0–34.0)
MCHC: 33.1 g/dL (ref 30.0–36.0)
MCV: 87.3 fL (ref 78.0–100.0)
Platelets: 371 10*3/uL (ref 150–400)
RBC: 3.94 MIL/uL (ref 3.87–5.11)
RDW: 12.8 % (ref 11.5–15.5)
WBC: 9.2 10*3/uL (ref 4.0–10.5)

## 2016-02-17 LAB — URINE CULTURE: Culture: NO GROWTH

## 2016-02-17 MED ORDER — SENNOSIDES-DOCUSATE SODIUM 8.6-50 MG PO TABS
1.0000 | ORAL_TABLET | Freq: Two times a day (BID) | ORAL | Status: DC
  Administered 2016-02-17 – 2016-02-18 (×3): 1 via ORAL
  Filled 2016-02-17 (×3): qty 1

## 2016-02-17 MED ORDER — CEPHALEXIN 500 MG PO CAPS
500.0000 mg | ORAL_CAPSULE | Freq: Two times a day (BID) | ORAL | Status: DC
  Administered 2016-02-17 – 2016-02-18 (×3): 500 mg via ORAL
  Filled 2016-02-17 (×3): qty 1

## 2016-02-17 NOTE — Discharge Summary (Signed)
[facility] Discharge Summary  Patient name: Amber Tyler Medical record number: [id number] Date of birth: [birth year] Age: 20 y.o. Gender: female Date of Admission: 02/15/2016  Date of Discharge: 02/18/2016 Admitting Physician: Ivor Costa, MD  Primary Care Provider: Nicholes Rough, PA-C Consultants: None  Indication for Hospitalization: Right-sided pyelonephritis  Discharge Diagnoses/Problem List:  Right-Sided Pyelonephritis  Urinary Tract Infection Nausea with Vomiting Depression POTS  Disposition: Home  Discharge Condition: Stable, imporved  Discharge Exam:  General: well nourished, well developed, in no acute distress with non-toxic appearance HEENT: normocephalic, atraumatic, moist mucous membranes CV: regular rate and rhythm without murmurs, rubs, or gallops Lungs: clear to auscultation bilaterally with normal work of breathing Abdomen: soft, minimal-tenderness to right flank, normoactive bowel sounds Skin: warm, dry, no rashes or lesions, cap refill < 2 seconds Extremities: warm and well perfused, normal tone  Brief Hospital Course:  Amber Tyler a 20 y.o.femalepresenting with R-sided pyelonephritis with emesis. PMH significant for depression and POTS.  Patient presented to the [facility] ED on 11/19 with n/v and right flank pain. She was seen in another ED 5 days earlier and was given Keflex 500 mg BID for a UTI w/ neg CT scan for nephrolithiasis concerning for pyelonephritis. Patient continued to have n/v and right flank pain. On arrival, u-preg neg. Given Rocephin IV for 24 hours then switched to Keflex 500 mg QID. Patient remained afebrile and leukocytosis resolved. Diet was advanced and pain improved prior to d/c.   Patient was given pain medication and is to complete 7 day course of abx for right sided pyelonephritis upon discharge with PCP f/u.   Issues for Follow Up:  1. Patient to take Keflex 500 mg QID for 5 more days for tot of 7 days  of abx 2. Given Reglan for nausea 3. Norco 5-325 for pain, #15 tabs, no refills 4. F/u with PCP  Significant Procedures: None  Significant Labs and Imaging:   Recent Labs Lab 02/15/16 2049 02/16/16 0417 02/17/16 1054  WBC 17.9* 12.4* 9.2  HGB 12.5 11.1* 11.4*  HCT 37.3 34.2* 34.4*  PLT 477* 351 371    Recent Labs Lab 02/11/16 1642 02/15/16 2049 02/16/16 0417 02/17/16 1054  NA 137 137 139 137  K 3.5 3.5 3.8 4.1  CL 104 106 110 108  CO2 25 23 24 25   GLUCOSE 112* 117* 95 87  BUN 10 7 5* <5*  CREATININE 0.78 0.60 0.78 0.71  CALCIUM 9.6 9.2 8.3* 8.9  ALKPHOS  --  97 82 84  AST  --  19 14* 15  ALT  --  12* 9* 11*  ALBUMIN  --  3.8 2.7* 3.0*   Results/Tests Pending at Time of Discharge: None  Discharge Medications:    Medication List    TAKE these medications   cephALEXin 500 MG capsule Commonly known as:  KEFLEX Take 1 capsule (500 mg total) by mouth 4 (four) times daily. What changed:  when to take this   DULoxetine 60 MG capsule Commonly known as:  CYMBALTA Take 60 mg by mouth daily.   HYDROcodone-acetaminophen 5-325 MG tablet Commonly known as:  NORCO/VICODIN Take 1 tablet by mouth every 6 (six) hours as needed for severe pain.   hydrOXYzine 25 MG tablet Commonly known as:  ATARAX/VISTARIL Take 1 tablet (25 mg total) by mouth every 6 (six) hours.   metoCLOPramide 5 MG tablet Commonly known as:  REGLAN Take 1 tablet (5 mg total) by mouth every 8 (eight) hours as needed  for nausea.   phenazopyridine 200 MG tablet Commonly known as:  PYRIDIUM Take 1 tablet (200 mg total) by mouth 3 (three) times daily as needed for pain.   senna-docusate 8.6-50 MG tablet Commonly known as:  Senokot-S Take 1 tablet by mouth 2 (two) times daily.       Discharge Instructions: Please refer to Patient Instructions section of EMR for full details.  Patient was counseled important signs and symptoms that should prompt return to medical care, changes in medications,  dietary instructions, activity restrictions, and follow up appointments.   Follow-Up Appointments: Patient to call PCP on d/c for f/u.  [place] Bing, DO 02/18/2016, 1:17 PM PGY-1, [place]

## 2016-02-17 NOTE — Progress Notes (Signed)
Family Medicine Teaching Service Daily Progress Note Intern Pager: [phone]  Patient name: Amber Tyler Medical record number: [id number] Date of birth: [birth year] Age: 20 y.o. Gender: female  Primary Care Provider: Nicholes Rough, PA-C Consultants: None Code Status: Full  Pt Overview and Major Events to Date:  11/20: admit by [facility] for failed out patient abx therapy for R-sided pyelonephritis, transferred to [place] service 11/21: UCx neg, transitioned from Rocephin IV to Keflex PO  Assessment and Plan: [name] a 20 y.o.femalepresenting with R-sided pyelonephritis with emesis. PMH significant for depression and POTS.  #Right-Sided Pyelonephritis / Urinary Tract Infection, Acute, Improved: R-sided abdominal pain radiating from R-CVA. UA on 11/15 significant for infection. Tx with Keflex inadequately out patient. Symptoms persist with nausea and pain. Switched to Rocephin IV. Does not appear septic and leukocytosis is improving 17.9>12.4. Remains afebrile. Cr remains wnl. UCx NGTD. PID was considered but unlikely given low risk, so GC/Cly urine held. --Will transition from Rocephin IV to Keflex 500 mg QID (day 2 of 7) --Monitor fever curve --CBC and BMET daily --Encouraged PO intake, holding IVF for now --Tylenol for pain, Norco 5-325 1 tab q4h PRN breakthrough pain --Will obtain a RUQ U/S given gallbladder hx and pain location --F/u CBC and CMET (WBC, transaminases, bili, alk phos)  #Nausea with Vomiting, Acute, Resolved: Non-bloody and non-bilious. Associated with R-sided pyelonephritis. No emesis overnight. BP remain normotensive. --Compazine q6h PRN --See plan above  #Depression, Chronic, Stable: Takes Cymbalta at home. Was in good spirits on admission. Denies depressive symptoms. --Cymbalta 60 mg QD  #POTS, Chronic, Stable: No syncopal episodes. Given recent emesis, could exacerbate but patient remains well hydrated. --Monitor fluid status  FEN/GI:  Regular diet PPx: SCDs  Disposition: Pending improvement of R-sided pyelonephritis, transition to PO abx, anticipate d/c home  Subjective:  No overnight events. Patient laying in bed. Says she ate 60% of her breakfast. Patient says "last night was rough due to pain but seems to be better controlled." Some nausea and discomfort. Denies emesis. No fevers, chills, dyspnea, CP, or abdominal pain.  Objective: Temp:  [98 F (36.7 C)-98.4 F (36.9 C)] 98.4 F (36.9 C) (11/21 0618) Pulse Rate:  [84-87] 84 (11/21 0618) Resp:  [16-17] 17 (11/21 0618) BP: (95-108)/(59-73) 95/59 (11/21 0618) SpO2:  [100 %] 100 % (11/21 0618) Weight:  [160 lb 7.9 oz (72.8 kg)] 160 lb 7.9 oz (72.8 kg) (11/20 2208) Physical Exam: General: well nourished, well developed, in no acute distress with non-toxic appearance HEENT: normocephalic, atraumatic, moist mucous membranes CV: regular rate and rhythm without murmurs, rubs, or gallops Lungs: clear to auscultation bilaterally with normal work of breathing Abdomen: soft, mild-tenderness to right flank, normoactive bowel sounds Skin: warm, dry, no rashes or lesions, cap refill < 2 seconds Extremities: warm and well perfused, normal tone  Laboratory:  UA (11/15): brown urine, cloudy, large Hgbm >300 protein, sm leuks, many bacteria UA (11/19): orange, pos nitrite, few bacteria, 0-5 squams UCx: NGTD U-preg: neg Lipase: 19 Lactic acid: 1.10  Recent Labs Lab 02/11/16 1642 02/15/16 2049 02/16/16 0417  WBC 18.6* 17.9* 12.4*  HGB 13.5 12.5 11.1*  HCT 40.3 37.3 34.2*  PLT 444* 477* 351    Recent Labs Lab 02/11/16 1642 02/15/16 2049 02/16/16 0417  NA 137 137 139  K 3.5 3.5 3.8  CL 104 106 110  CO2 '25 23 24  ' BUN 10 7 5*  CREATININE 0.78 0.60 0.78  CALCIUM 9.6 9.2 8.3*  PROT  --  7.3 5.4*  BILITOT  --  0.3 0.3  ALKPHOS  --  97 82  ALT  --  12* 9*  AST  --  19 14*  GLUCOSE 112* 117* 95   Imaging/Diagnostic Tests: CT Renal Stone Study  (02/11/2016) FINDINGS: Lower chest: The lung bases are clear of acute process. No pleural effusion or pulmonary lesions. The heart is normal in size. No pericardial effusion. The distal esophagus and aorta are unremarkable. Hepatobiliary: No focal hepatic lesions or intrahepatic biliary dilatation. The gallbladder is normal. No common bile duct dilatation. Pancreas: No mass, inflammation or ductal dilatation. Spleen: Normal size.  No focal lesions. Adrenals/Urinary Tract: The adrenal glands and kidneys are unremarkable. No renal, ureteral or bladder calculi or mass. Stomach/Bowel: The stomach, duodenum, small bowel and colon are grossly normal without oral contrast. No inflammatory changes, mass lesions or obstructive findings. The terminal ileum and appendix are normal. Vascular/Lymphatic: No significant vascular findings are present. No enlarged abdominal or pelvic lymph nodes. Reproductive: The uterus and ovaries are unremarkable. Other: No pelvic mass or adenopathy. Free pelvic fluid is noted. This is likely physiologic. No inguinal mass or adenopathy. No abdominal wall hernia or subcutaneous lesions. Musculoskeletal: No significant bony findings.  IMPRESSION: No acute abdominal/ pelvic findings, mass lesions or adenopathy. No renal, ureteral or bladder calculi or mass. Small amount of free pelvic fluid is likely physiologic. A ruptured ovarian cysts is also possible.   [place] Bing, DO 02/17/2016, 8:24 AM PGY-1, [place] Intern pager: [phone], text pages welcome

## 2016-02-18 DIAGNOSIS — N12 Tubulo-interstitial nephritis, not specified as acute or chronic: Principal | ICD-10-CM

## 2016-02-18 MED ORDER — CEPHALEXIN 500 MG PO CAPS: 500.0000 mg | ORAL_CAPSULE | Freq: Four times a day (QID) | ORAL | 0 refills | Status: DC

## 2016-02-18 MED ORDER — SENNOSIDES-DOCUSATE SODIUM 8.6-50 MG PO TABS: 1.0000 | ORAL_TABLET | Freq: Two times a day (BID) | ORAL | 0 refills | Status: DC

## 2016-02-18 MED ORDER — METOCLOPRAMIDE HCL 5 MG PO TABS: 5.0000 mg | ORAL_TABLET | Freq: Three times a day (TID) | ORAL | 0 refills | Status: DC | PRN

## 2016-02-18 MED ORDER — HYDROCODONE-ACETAMINOPHEN 5-325 MG PO TABS: 1.0000 | ORAL_TABLET | Freq: Four times a day (QID) | ORAL | 0 refills | Status: DC | PRN

## 2016-02-18 MED ORDER — CEPHALEXIN 500 MG PO CAPS: 500.0000 mg | ORAL_CAPSULE | Freq: Four times a day (QID) | ORAL | 0 refills | Status: AC

## 2016-02-18 NOTE — Discharge Instructions (Signed)
You were admitted to the hospital for a kidney infection on your right side. You were inadequately treated with antibiotics originally. We gave you another antibiotic by IV and restarted your Keflex at a more frequent dosing. Take this for 5 more days. You will also be given some pain pills and medication to help with you bowel movements. Please schedule follow up with your PCP.

## 2016-02-18 NOTE — Progress Notes (Signed)
Discharge instructions and medications discussed with patient.  Prescriptions given to patient.  All questions answered.  

## 2016-02-18 NOTE — Progress Notes (Signed)
Family Medicine Teaching Service Daily Progress Note Intern Pager: [phone]  Patient name: Amber Tyler Medical record number: [id number] Date of birth: [birth year] Age: 20 y.o. Gender: female  Primary Care Provider: Nicholes Rough, PA-C Consultants: None Code Status: Full  Pt Overview and Major Events to Date:  11/20: admit by [facility]ists for failed out patient abx therapy for R-sided pyelonephritis, transferred to [place] service 11/21: UCx neg, transitioned from Rocephin IV to Keflex PO  Assessment and Plan: Amber Holderis a 20 y.o.femalepresenting with R-sided pyelonephritis with emesis. PMH significant for depression and POTS.  #Right-Sided Pyelonephritis / Urinary Tract Infection, Acute, Improved: R-sided abdominal pain radiating from R-CVA. UA on 11/15 significant for infection. Tx with Keflex inadequately out patient. Symptoms persist with nausea and pain. Switched to Rocephin IV. Does not appear septic and leukocytosis is resolved. Remains afebrile. Cr remains wnl. UCx NGTD. PID was considered but unlikely given low risk, so GC/Cly urine held. RUQ U/S neg for gallbladder stones, some fatty liver. --Continue Keflex 500 mg QID (day 3 of 7) --Monitor fever curve --Encouraged PO intake --Tylenol for pain, Norco 5-325 1 tab q4h PRN breakthrough pain  #Nausea with Vomiting, Acute, Resolved: Non-bloody and non-bilious. Associated with R-sided pyelonephritis. No emesis overnight. BP remain normotensive. --Compazine q6h PRN --See plan above  #Depression, Chronic, Stable: Takes Cymbalta at home. Was in good spirits on admission. Denies depressive symptoms. --Cymbalta 60 mg QD  #POTS, Chronic, Stable: No syncopal episodes. Given recent emesis, could exacerbate but patient remains well hydrated. --Monitor fluid status  FEN/GI: Regular diet PPx: SCDs  Disposition: anticipate d/c home today  Subjective:  No overnight events. Pt says pain is much better and no longer  nauseous. Able to eat and drink. Has not passed a bowel movement but states she is passing gas and has chronic constipation. No chest pain, dyspnea, fever, chills. Says she is ready to go home.  Objective: Temp:  [98.4 F (36.9 C)-98.7 F (37.1 C)] 98.4 F (36.9 C) (11/22 0620) Pulse Rate:  [81-109] 81 (11/22 0620) Resp:  [18] 18 (11/22 0620) BP: (101-106)/(60) 106/60 (11/22 0620) SpO2:  [99 %] 99 % (11/22 0620) Weight:  [171 lb 14.4 oz (78 kg)] 171 lb 14.4 oz (78 kg) (11/21 2119) Physical Exam: General: well nourished, well developed, in no acute distress with non-toxic appearance HEENT: normocephalic, atraumatic, moist mucous membranes CV: regular rate and rhythm without murmurs, rubs, or gallops Lungs: clear to auscultation bilaterally with normal work of breathing Abdomen: soft, minimal-tenderness to right flank, normoactive bowel sounds Skin: warm, dry, no rashes or lesions, cap refill < 2 seconds Extremities: warm and well perfused, normal tone  Laboratory:  UA (11/15): brown urine, cloudy, large Hgbm >300 protein, sm leuks, many bacteria UA (11/19): orange, pos nitrite, few bacteria, 0-5 squams UCx: NGTD U-preg: neg Lipase: 19 Lactic acid: 1.10  Recent Labs Lab 02/15/16 2049 02/16/16 0417 02/17/16 1054  WBC 17.9* 12.4* 9.2  HGB 12.5 11.1* 11.4*  HCT 37.3 34.2* 34.4*  PLT 477* 351 371    Recent Labs Lab 02/15/16 2049 02/16/16 0417 02/17/16 1054  NA 137 139 137  K 3.5 3.8 4.1  CL 106 110 108  CO2 23 24 25   BUN 7 5* <5*  CREATININE 0.60 0.78 0.71  CALCIUM 9.2 8.3* 8.9  PROT 7.3 5.4* 5.8*  BILITOT 0.3 0.3 0.3  ALKPHOS 97 82 84  ALT 12* 9* 11*  AST 19 14* 15  GLUCOSE 117* 95 87   Imaging/Diagnostic Tests: CT Renal Stone  Study (02/11/2016) FINDINGS: Lower chest: The lung bases are clear of acute process. No pleural effusion or pulmonary lesions. The heart is normal in size. No pericardial effusion. The distal esophagus and aorta are  unremarkable. Hepatobiliary: No focal hepatic lesions or intrahepatic biliary dilatation. The gallbladder is normal. No common bile duct dilatation. Pancreas: No mass, inflammation or ductal dilatation. Spleen: Normal size.  No focal lesions. Adrenals/Urinary Tract: The adrenal glands and kidneys are unremarkable. No renal, ureteral or bladder calculi or mass. Stomach/Bowel: The stomach, duodenum, small bowel and colon are grossly normal without oral contrast. No inflammatory changes, mass lesions or obstructive findings. The terminal ileum and appendix are normal. Vascular/Lymphatic: No significant vascular findings are present. No enlarged abdominal or pelvic lymph nodes. Reproductive: The uterus and ovaries are unremarkable. Other: No pelvic mass or adenopathy. Free pelvic fluid is noted. This is likely physiologic. No inguinal mass or adenopathy. No abdominal wall hernia or subcutaneous lesions. Musculoskeletal: No significant bony findings.  IMPRESSION: No acute abdominal/ pelvic findings, mass lesions or adenopathy. No renal, ureteral or bladder calculi or mass. Small amount of free pelvic fluid is likely physiologic. A ruptured ovarian cysts is also possible.  US Abdomen Limited RUQ (02/17/2016) FINDINGS: Gallbladder: The gallbladder is visualized and no gallstones are noted. Common bile duct: Diameter: The common bile duct is normal measuring 3.6 mm in diameter. Liver: The echogenicity of the liver is somewhat inhomogeneous and slightly increased suggesting mild fatty infiltration. Correlation with LFTs is recommended. No focal hepatic abnormality is seen.  IMPRESSION: 1. No gallstones. 2. Question mild fatty infiltration of the liver. Correlate with LFTs.    Amber Bing, DO 02/18/2016, 9:04 AM PGY-1, [place] Intern pager: [phone], text pages welcome

## 2016-02-27 ENCOUNTER — Inpatient Hospital Stay: Payer: BLUE CROSS/BLUE SHIELD | Admitting: Student in an Organized Health Care Education/Training Program

## 2016-02-27 ENCOUNTER — Encounter: Payer: Self-pay | Admitting: Family Medicine

## 2016-02-27 ENCOUNTER — Other Ambulatory Visit: Payer: Self-pay | Admitting: *Deleted

## 2016-02-27 ENCOUNTER — Ambulatory Visit (INDEPENDENT_AMBULATORY_CARE_PROVIDER_SITE_OTHER): Payer: BLUE CROSS/BLUE SHIELD | Admitting: Family Medicine

## 2016-02-27 VITALS — BP 123/91 | HR 92 | Ht 64.0 in | Wt 160.0 lb

## 2016-02-27 DIAGNOSIS — F4323 Adjustment disorder with mixed anxiety and depressed mood: Secondary | ICD-10-CM

## 2016-02-27 DIAGNOSIS — N10 Acute tubulo-interstitial nephritis: Secondary | ICD-10-CM | POA: Diagnosis not present

## 2016-02-27 MED ORDER — BUSPIRONE HCL 7.5 MG PO TABS: 7.5000 mg | ORAL_TABLET | Freq: Two times a day (BID) | ORAL | 1 refills | Status: DC

## 2016-02-29 DIAGNOSIS — F4323 Adjustment disorder with mixed anxiety and depressed mood: Secondary | ICD-10-CM | POA: Insufficient documentation

## 2016-02-29 NOTE — Progress Notes (Signed)
    CHIEF COMPLAINT / HPI:  1. F/u hospitalization pyelonephritis. Still feels a little fatigued but otherwise symptoms of flank pain, fever, nausea and vomiting resolved. Completed her antibiotics. 2. Having some increased anxiety over last 1-2 months. Not sure why.  Is taking her antidepressant as scheduled and that helps. Feels like she is over-sensitive to her co-workers being too loud, arguing too much. ANy loud noise seems to startle her more than others. Feels really frazzled at the end of  The day. No current SI. Sleeping Ok and appetite is OK. Energy level low. No new stressors that she can think of. Not exercising regularly. 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  REVIEW OF SYSTEMS:  See HPI. Additionally, no urinary frequency, no unusual weight change, no hallucinations  OBJECTIVE:  Vital signs are reviewed.  Vital signs reviewed. GENERAL: Well-developed, well-nourished, no acute distress. CARDIOVASCULAR: Regular rate and rhythm no murmur gallop or rub LUNGS: Clear to auscultation bilaterally, no rales or wheeze. ABDOMEN: Soft positive bowel sounds NEURO: No gross focal neurological deficits. MSK: Movement of extremity x 4. PSYCH AXOX4. Normal speech fluency and content. Judgement and memory seem intact. Affect is a little flat but imprves as the visit progresses.   ASSESSMENT / PLAN: 1. Recent pyelonephritis seems resolved 2. Anxiety: given her prior hx of suicidal gestures I would avoid any benzodiazepines. Will try adding buspirone and see her back in one month. Continue her current antidepressant. Discussed support systems, therapy (not interested in attending currently)safety plan if she develops SI.

## 2016-03-24 ENCOUNTER — Other Ambulatory Visit: Payer: Self-pay | Admitting: Family Medicine

## 2016-03-24 MED ORDER — BUSPIRONE HCL 7.5 MG PO TABS: 7.5000 mg | ORAL_TABLET | Freq: Two times a day (BID) | ORAL | 1 refills | Status: AC

## 2016-03-24 NOTE — Progress Notes (Signed)
Said her rx was not at pharmacy Will resend

## 2016-04-23 ENCOUNTER — Encounter: Payer: Self-pay | Admitting: Internal Medicine

## 2016-04-23 ENCOUNTER — Ambulatory Visit (INDEPENDENT_AMBULATORY_CARE_PROVIDER_SITE_OTHER): Payer: BLUE CROSS/BLUE SHIELD | Admitting: Internal Medicine

## 2016-04-23 VITALS — BP 102/66 | HR 96 | Temp 98.3°F | Wt 160.0 lb

## 2016-04-23 DIAGNOSIS — N920 Excessive and frequent menstruation with regular cycle: Secondary | ICD-10-CM | POA: Diagnosis not present

## 2016-04-23 DIAGNOSIS — M2550 Pain in unspecified joint: Secondary | ICD-10-CM | POA: Diagnosis not present

## 2016-04-23 DIAGNOSIS — R21 Rash and other nonspecific skin eruption: Secondary | ICD-10-CM | POA: Diagnosis not present

## 2016-04-23 DIAGNOSIS — M797 Fibromyalgia: Secondary | ICD-10-CM | POA: Diagnosis not present

## 2016-04-23 LAB — POCT HEMOGLOBIN: Hemoglobin: 12.7 g/dL (ref 12.2–16.2)

## 2016-04-23 MED ORDER — NORTRIPTYLINE HCL 10 MG PO CAPS: 10.0000 mg | ORAL_CAPSULE | Freq: Every day | ORAL | 0 refills | Status: DC

## 2016-04-23 NOTE — Patient Instructions (Signed)
It was so nice to see you!  I have prescribed a new medication called Nortriptyline. It is generally tolerated better than Amitriptyline. Please continue to take the Cymbalta as you are.  We have sent in a lab for lupus. This will likely take a while to come back. I will call you with the results.  Please schedule an appointment to have an IUD placed in the next few weeks with Dr. Nori Riis.  -Dr. Brett Albino

## 2016-04-25 DIAGNOSIS — N920 Excessive and frequent menstruation with regular cycle: Secondary | ICD-10-CM | POA: Insufficient documentation

## 2016-04-25 DIAGNOSIS — R21 Rash and other nonspecific skin eruption: Secondary | ICD-10-CM | POA: Insufficient documentation

## 2016-04-25 DIAGNOSIS — M797 Fibromyalgia: Secondary | ICD-10-CM | POA: Insufficient documentation

## 2016-04-25 NOTE — Assessment & Plan Note (Signed)
Not well controlled. Taking Cymbalta 60mg  daily at home, which is no longer helping as much as it used to. Previously tried on max dose Gabapentin, Wellbutrin, and Amitriptyline. - Continue Cymbalta 60mg  daily. Should consider decreasing back to 30 in the future. - Add Nortriptyline 10mg  qhs - Discussed with Dr. Nori Riis (PCP) - Follow-up with PCP in the next few weeks.

## 2016-04-25 NOTE — Assessment & Plan Note (Signed)
Having to change 1 super tampon every hour. Periods are regular. Pt having some dizziness when she is on her period. Pt in a monogamous relationship with her wife. - Check POC Hgb, as Pt had some dizziness today and was recently on her period - Discussed different birth control methods in detail. Pt is interested in IUD. Will discuss further with wife and schedule appointment with PCP in the next few weeks if they decide they want to pursue this option.

## 2016-04-25 NOTE — Assessment & Plan Note (Signed)
Occurring every couple of weeks on the neck and face. Worse when out in the sun. Previous PCP discussed testing for lupus given her associated diffuse musculoskeletal pain, but this has never been performed. No rash present on exam today. - Will obtain anti-ds-DNA antibodies to rule out lupus

## 2016-04-25 NOTE — Progress Notes (Signed)
[facility] Phone: [phone]  Subjective:  Amber Tyler is a 21 year old female presenting to clinic for follow-up of her fibromyalgia and menorrhagia.  Fibromyalgia: Has been going on for many years. Pt has been on Cymbalta for 2 years. She felt like this was helping a lot at first, but has recently noticed that it is not helping her pain. Her pain is mostly located in the neck, shoulders, hips, wrists, and fingers. Her pain is worse at night. She has tried taking herself off the Cymbalta, but she starts having intense headaches and nausea. She has been on Gabapentin in the past, which didn't help at all. She has also been on Wellbutrin and Amitriptyline, which have made her feel bad.   Rash: Pt states she has been having a rash that comes and goes for the last few months. She will have the rash every couple of weeks. The rash is "red and blotchy" and occurs on her face and neck. The rash occurs more often when she is out in the sun. She does not take any medications for the rash. She discussed this with her previous PCP, who was considering ordering labs to rule out lupus. No itchiness of the rash. No fevers, no chills.  Menorrhagia: Pt has been having very heavy periods for years. She has to change a super tampon every hour. Her periods last 7-10 days. She has a period every month. LMP 1/17. She often feels more dizzy when she is on her period. She did have some dizziness today. No shortness of breath. No palpitations. She was on OCPs when she was younger, but has not been on any birth control recently. She is sexually active with her wife and does not have any other sexual partners.   ROS: See HPI for pertinent positives and negatives  Past Medical History- bipolar disorder, fibromyalgia, adjustment disorder, ?POTS  Family history reviewed for today's visit. No changes.  Social history- patient is a never smoker  Objective: BP 102/66   Pulse 96   Temp 98.3 F  (36.8 C) (Oral)   Wt 160 lb (72.6 kg)   LMP 04/15/2016   SpO2 99%   BMI 27.46 kg/m  Gen: NAD, alert, cooperative with exam HEENT: NCAT, EOMI, MMM Neck: FROM, supple CV: RRR, no murmur Resp: CTABL, no wheezes, normal work of breathing GI: SNTND, BS present, no guarding or organomegaly Msk: No edema, warm, normal tone, moves UE/LE spontaneously Skin: No rashes, no lesions  Assessment/Plan: Fibromyalgia: Not well controlled. Taking Cymbalta 60mg  daily at home, which is no longer helping as much as it used to. Previously tried on max dose Gabapentin, Wellbutrin, and Amitriptyline. - Continue Cymbalta 60mg  daily. Should consider decreasing back to 30 in the future. - Add Nortriptyline 10mg  qhs - Discussed with Dr. Nori Riis (PCP) - Follow-up with PCP in the next few weeks.  Rash: Occurring every couple of weeks on the neck and face. Worse when out in the sun. Previous PCP discussed testing for lupus given her associated diffuse musculoskeletal pain, but this has never been performed. No rash present on exam today. - Will obtain anti-ds-DNA antibodies to rule out lupus  Menorrhagia: Having to change 1 super tampon every hour. Periods are regular. Pt having some dizziness when she is on her period. Pt in a monogamous relationship with her wife. - Check POC Hgb, as Pt had some dizziness today and was recently on her period - Discussed different birth control methods in detail. Pt is  interested in IUD. Will discuss further with wife and schedule appointment with PCP in the next few weeks if they decide they want to pursue this option.   Hyman Bible, MD PGY-2

## 2016-04-26 ENCOUNTER — Telehealth: Payer: Self-pay | Admitting: *Deleted

## 2016-04-26 LAB — ANTI-DNA ANTIBODY, DOUBLE-STRANDED: ds DNA Ab: <1 IU/mL

## 2016-04-26 NOTE — Telephone Encounter (Signed)
LM for patient to call back. Jazmin Hartsell,CMA  

## 2016-04-26 NOTE — Telephone Encounter (Signed)
-----   Message from Sela Hua, MD sent at 04/26/2016  4:15 PM EST ----- Please let Amber Tyler know that her lupus test was negative. Thanks!

## 2016-04-27 NOTE — Telephone Encounter (Signed)
Pt informed of negative test result. Pt had no further questions or concerns.

## 2016-05-13 ENCOUNTER — Ambulatory Visit (INDEPENDENT_AMBULATORY_CARE_PROVIDER_SITE_OTHER): Payer: Self-pay | Admitting: Family Medicine

## 2016-05-13 VITALS — BP 110/78 | HR 93 | Temp 98.2°F | Wt 176.0 lb

## 2016-05-13 DIAGNOSIS — Z30430 Encounter for insertion of intrauterine contraceptive device: Secondary | ICD-10-CM

## 2016-05-13 MED ORDER — NORGESTIMATE-ETH ESTRADIOL 0.25-35 MG-MCG PO TABS: 1.0000 | ORAL_TABLET | Freq: Every day | ORAL | 3 refills | Status: AC

## 2016-05-14 NOTE — Progress Notes (Signed)
Patient insurance has not started yet so we were unable to provide IUD. She opted instead for OCP which I have called in She will follow up with me in a 1-2 months

## 2016-06-21 ENCOUNTER — Emergency Department (HOSPITAL_BASED_OUTPATIENT_CLINIC_OR_DEPARTMENT_OTHER)
Admission: EM | Admit: 2016-06-21 | Discharge: 2016-06-21 | Disposition: A | Discharge status: Home / Self Care | Payer: Self-pay | Attending: Emergency Medicine | Admitting: Emergency Medicine

## 2016-06-21 ENCOUNTER — Emergency Department (HOSPITAL_BASED_OUTPATIENT_CLINIC_OR_DEPARTMENT_OTHER): Payer: Self-pay

## 2016-06-21 ENCOUNTER — Encounter (HOSPITAL_BASED_OUTPATIENT_CLINIC_OR_DEPARTMENT_OTHER): Payer: Self-pay

## 2016-06-21 DIAGNOSIS — X501XXA Overexertion from prolonged static or awkward postures, initial encounter: Secondary | ICD-10-CM | POA: Insufficient documentation

## 2016-06-21 DIAGNOSIS — Y999 Unspecified external cause status: Secondary | ICD-10-CM | POA: Insufficient documentation

## 2016-06-21 DIAGNOSIS — J45909 Unspecified asthma, uncomplicated: Secondary | ICD-10-CM | POA: Insufficient documentation

## 2016-06-21 DIAGNOSIS — Y939 Activity, unspecified: Secondary | ICD-10-CM | POA: Insufficient documentation

## 2016-06-21 DIAGNOSIS — M25561 Pain in right knee: Secondary | ICD-10-CM | POA: Insufficient documentation

## 2016-06-21 DIAGNOSIS — Y929 Unspecified place or not applicable: Secondary | ICD-10-CM | POA: Insufficient documentation

## 2016-06-21 MED ORDER — IBUPROFEN 800 MG PO TABS
800.0000 mg | ORAL_TABLET | Freq: Once | ORAL | Status: AC
  Administered 2016-06-21: 800 mg via ORAL
  Filled 2016-06-21: qty 1

## 2016-06-21 MED ORDER — HYDROCODONE-ACETAMINOPHEN 5-325 MG PO TABS: 1.0000 | ORAL_TABLET | ORAL | 0 refills | Status: AC | PRN

## 2016-06-21 MED FILL — HYDROCODON-APAP 5-325: 5-325 | 2 days supply | Qty: 6 | Fill #0

## 2016-06-21 NOTE — ED Triage Notes (Signed)
Pt states she was "messing around" last night and felt her knee pop, states it was out of place and now she is having right knee pain and her toes are "tingling."  No visible deformity, pt is holding leg straight out.

## 2016-06-21 NOTE — ED Provider Notes (Signed)
[facility] Provider Note   CSN: [id number] Arrival date & time: 06/21/16  1500     History   Chief Complaint Chief Complaint  Patient presents with  . Knee Pain    HPI Amber Tyler is a 21 y.o. female who presents today with chief complaint right knee pain since last night. States she bent down last night and as she stood up her "kneecap dislocated". States it took her around 15 minutes to put kneecap back in place. 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 States she felt immediate onset of pain, which has worsened and is now 7/10 sharp pain with movement, flexion, and weight bearing. Pain radiates down to ankle. Pain worst at lateral knee and posteriorly. She endorses development of numbness along dorsum of foot and tingling of toes this morning. Advil has not been helpful for pain. Denies hitting her head, neck pain, back pain, weakness, sob/cp, HA, dizziness, abd pain, n/v/d.   The history is provided by the patient.    Past Medical History:  Diagnosis Date  . Asthma   . Fibromyalgia   . POTS (postural orthostatic tachycardia syndrome)     Patient Active Problem List   Diagnosis Date Noted  . Fibromyalgia 04/25/2016  . Rash and nonspecific skin eruption 04/25/2016  . Menorrhagia 04/25/2016  . Adjustment disorder with mixed anxiety and depressed mood 02/29/2016  . RUQ pain   . Leukocytosis   . Nausea & vomiting 02/16/2016  . Pyelonephritis 02/15/2016  . 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 Awareness of heartbeats 12/01/2014  . Thrombocytosis ([place]) 11/19/2013  . Other malaise and fatigue 11/15/2013  . Frequent headaches 04/05/2013  . Dysmenorrhea 05/05/2012  . Concussion 02/29/2012  . Bipolar disorder ([place]) 11/17/2011  . Overdose of antidepressant 11/16/2011  . Asthma 07/21/2010    Past Surgical History:  Procedure Laterality Date  . ADENOIDECTOMY    . ANKLE SURGERY Right   . internal heart monitor     . KNEE SURGERY    . TONSILLECTOMY      OB History    No data available       Home Medications    Prior to Admission medications   Medication Sig Start Date End Date Taking? Authorizing Provider  busPIRone (BUSPAR) 7.5 MG tablet Take 1 tablet (7.5 mg total) by mouth 2 (two) times daily. 03/24/16   Dickie La, MD  DULoxetine (CYMBALTA) 60 MG capsule Take 60 mg by mouth daily.    Historical Provider, MD  HYDROcodone-acetaminophen (NORCO/VICODIN) 5-325 MG tablet Take 1 tablet by mouth every 4 (four) hours as needed. 06/21/16 06/22/16  Renita Papa, PA  norgestimate-ethinyl estradiol (ORTHO-CYCLEN,SPRINTEC,PREVIFEM) 0.25-35 MG-MCG tablet Take 1 tablet by mouth daily. 05/13/16   Dickie La, MD  nortriptyline (PAMELOR) 10 MG capsule Take 1 capsule (10 mg total) by mouth at bedtime. 04/23/16   Sela Hua, MD    Family History Family History  Problem Relation Age of Onset  . Diabetes Neg Hx     Social History Social History  Substance Use Topics  . Smoking status: Never Smoker  . Smokeless tobacco: Never Used  . Alcohol use Yes     Comment: occ     Allergies   Ondansetron hcl; Sulfonamide derivatives; and Tape   Review of Systems Review of Systems  Constitutional: Negative for chills and fever.  Respiratory: Negative for shortness of breath.   Cardiovascular: Negative for chest pain.  Gastrointestinal:  Negative for abdominal pain, diarrhea, nausea and vomiting.  Musculoskeletal: Positive for arthralgias and joint swelling. Negative for back pain and neck pain.  Skin: Negative for rash and wound.  Neurological: Positive for numbness. Negative for dizziness, syncope and headaches.     Physical Exam Updated Vital Signs BP 130/79 (BP Location: Right Arm)   Pulse 87   Temp 98.3 F (36.8 C) (Oral)   Resp 18   Ht 5\' 5"  (1.651 m)   Wt 72.6 kg   LMP 06/16/2016   SpO2 100%   BMI 26.63 kg/m   Physical Exam  Constitutional: She is oriented to person, place, and  time. She appears well-developed and well-nourished. No distress.  HENT:  Head: Normocephalic and atraumatic.  Eyes: Conjunctivae and EOM are normal. Right eye exhibits no discharge. Left eye exhibits no discharge. No scleral icterus.  Neck: No JVD present. No tracheal deviation present. No thyromegaly present.  Cardiovascular: Normal rate, regular rhythm, normal heart sounds and intact distal pulses.   2+ dp/pt pulses  Pulmonary/Chest: Effort normal and breath sounds normal.  Abdominal: Soft. She exhibits no distension. There is no tenderness.  Musculoskeletal: She exhibits edema and tenderness.  Full ROM of left knee, full ROM of right ankle. Limited flexion of right knee due to pain. Moderate posterior swelling at the popliteal fossa. Maximal tenderness along LCL and posteriorly. Patella does not appear to be dislocated. No varus/valgus instability noted. Negative anterior drawer/posterior drawer tests. No crepitus, erythema, or warmth of knee joint noted. No foot drop. No calf tenderness.  Neurological: She is alert and oriented to person, place, and time.  Endorses numbness along dorsum of right foot, tingling of toes, no weakness appreciated  Skin: Skin is warm and dry. Capillary refill takes less than 2 seconds. No rash noted. She is not diaphoretic. No erythema.  Psychiatric: She has a normal mood and affect. Her behavior is normal. Judgment and thought content normal.     ED Treatments / Results  Labs (all labs ordered are listed, but only abnormal results are displayed) Labs Reviewed - No data to display  EKG  EKG Interpretation None       Radiology Dg Knee Complete 4 Views Right  Result Date: 06/21/2016 CLINICAL DATA:  The patient felt a pop in her right knee last night with onset of pain. EXAM: RIGHT KNEE - COMPLETE 4+ VIEW COMPARISON:  Plain films right knee 07/22/2015 and 04/10/2012. FINDINGS: No evidence of fracture, dislocation, or joint effusion. No evidence of  arthropathy or other focal bone abnormality. Soft tissues are unremarkable. IMPRESSION: Normal exam. Electronically Signed   By: Inge Rise M.D.   On: 06/21/2016 15:23    Procedures Procedures (including critical care time)  Medications Ordered in ED Medications  ibuprofen (ADVIL,MOTRIN) tablet 800 mg (800 mg Oral Given 06/21/16 1542)     Initial Impression / Assessment and Plan / ED Course  I have reviewed the triage vital signs and the nursing notes.  Pertinent labs & imaging results that were available during my care of the patient were reviewed by me and considered in my medical decision making (see chart for details).     21yof presents to ED with chief complaint right knee pain since last night after dislocating patella while standing up. Associated numbness and tingling of foot. Pt afebrile, VSS. Given ibuprofen in ED for pain. Xray of knee negative for focal bone abnormality, fracture, and unremarkable soft tissues. Posterior area of fluctuance on exam, possible Baker's cyst or  soft tissue swelling. Low suspicion DVT or fracture. Discussed need for follow up with orthopedics for further evaluation of knee pain but no further emergent workup required. Pt is amenable to this plan. Provided rx for norco prn pain, as well as knee immobilizer and crutches. Discussed RICE, heat, and NSAIDs for pain. Discussed strict ED return precautions.   Final Clinical Impressions(s) / ED Diagnoses   Final diagnoses:  Acute pain of right knee    New Prescriptions Discharge Medication List as of 06/21/2016  4:06 PM    START taking these medications   Details  HYDROcodone-acetaminophen (NORCO/VICODIN) 5-325 MG tablet Take 1 tablet by mouth every 4 (four) hours as needed., Starting Mon 06/21/2016, Until Tue 06/22/2016, [place], [place] 06/21/16 [place], MD 06/21/16 [phone]

## 2016-07-07 ENCOUNTER — Other Ambulatory Visit (HOSPITAL_COMMUNITY): Payer: Self-pay | Admitting: Orthopedic Surgery

## 2016-07-07 DIAGNOSIS — M25561 Pain in right knee: Secondary | ICD-10-CM

## 2016-07-16 ENCOUNTER — Ambulatory Visit (HOSPITAL_COMMUNITY)
Admission: RE | Admit: 2016-07-16 | Discharge: 2016-07-16 | Disposition: A | Discharge status: Home / Self Care | Payer: BLUE CROSS/BLUE SHIELD | Source: Ambulatory Visit | Attending: Orthopedic Surgery | Admitting: Orthopedic Surgery

## 2016-07-16 DIAGNOSIS — M25561 Pain in right knee: Secondary | ICD-10-CM

## 2016-08-19 ENCOUNTER — Encounter: Payer: Self-pay | Admitting: Obstetrics and Gynecology

## 2016-08-19 ENCOUNTER — Other Ambulatory Visit (HOSPITAL_COMMUNITY)
Admission: RE | Admit: 2016-08-19 | Discharge: 2016-08-19 | Disposition: A | Discharge status: Home / Self Care | Payer: BLUE CROSS/BLUE SHIELD | Source: Ambulatory Visit | Attending: Emergency Medicine | Admitting: Emergency Medicine

## 2016-08-19 ENCOUNTER — Ambulatory Visit (INDEPENDENT_AMBULATORY_CARE_PROVIDER_SITE_OTHER): Payer: BLUE CROSS/BLUE SHIELD | Admitting: Obstetrics and Gynecology

## 2016-08-19 VITALS — BP 120/78 | HR 84 | Temp 98.4°F | Wt 184.0 lb

## 2016-08-19 DIAGNOSIS — Z01419 Encounter for gynecological examination (general) (routine) without abnormal findings: Secondary | ICD-10-CM | POA: Insufficient documentation

## 2016-08-19 DIAGNOSIS — Z124 Encounter for screening for malignant neoplasm of cervix: Secondary | ICD-10-CM | POA: Diagnosis not present

## 2016-08-19 DIAGNOSIS — N750 Cyst of Bartholin's gland: Secondary | ICD-10-CM | POA: Diagnosis not present

## 2016-08-19 DIAGNOSIS — N898 Other specified noninflammatory disorders of vagina: Secondary | ICD-10-CM | POA: Diagnosis not present

## 2016-08-19 LAB — POCT WET PREP (WET MOUNT)
Clue Cells Wet Prep Whiff POC: NEGATIVE
Trichomonas Wet Prep HPF POC: ABSENT

## 2016-08-19 MED ORDER — METRONIDAZOLE 500 MG PO TABS: 500.0000 mg | ORAL_TABLET | Freq: Two times a day (BID) | ORAL | 0 refills | Status: AC

## 2016-08-19 NOTE — Patient Instructions (Addendum)
No concerning signs on vaginal exam Wet prep showed some clue cells will treat with flagyl Monitor lump and if worsens return to clinic  Follow-up appointment for IUD  Bartholin Cyst or Abscess A Bartholin cyst is a fluid-filled sac that forms on a Bartholin gland. Bartholin glands are small glands that are located within the folds of skin (labia) along the sides of the lower opening of the vagina. These glands produce a fluid to moisten the outside of the vagina during sexual intercourse. A Bartholin cyst causes a bulge on the side of the vagina. A cyst that is not large or infected may not cause symptoms or problems. However, if the fluid within the cyst becomes infected, the cyst can turn into an abscess. An abscess may cause discomfort or pain. What are the causes? A Bartholin cyst may develop when the duct of the gland becomes blocked. In many cases, the cause of this is not known. Various kinds of bacteria can cause the cyst to become infected and develop into an abscess. What increases the risk? You may be at an increased risk of developing a Bartholin cyst or abscess if:  You are a woman of reproductive age.  You have a history of previous Bartholin cysts or abscesses.  You have diabetes.  You have a sexually transmitted disease (STD). What are the signs or symptoms? The severity of symptoms varies depending on the size of the cyst and whether it is infected. Symptoms may include:  A bulge or swelling near the lower opening of your vagina.  Discomfort or pain.  Redness.  Pain during sexual intercourse.  Pain when walking.  Fluid draining from the area. How is this diagnosed? Your health care provider may make a diagnosis based on your symptoms and a physical exam. He or she will look for swelling in your vaginal area. Blood tests may be done to check for infections. A sample of fluid from the cyst or abscess may also be taken to be tested in a lab. How is this  treated? Small cysts that are not infected may not require any treatment. These often go away on their own. Yourhealth care provider will recommend hot baths and the use of warm compresses. These may also be part of the treatment for an abscess. Treatment options for a large cyst or abscess may include:  Antibiotic medicine.  A surgical procedure to drain the abscess. One of the following procedures may be done:  Incision and drainage. An incision is made in the cyst or abscess so that the fluid drains out. A catheter may be placed inside the cyst so that it does not close and fill up with fluid again. The catheter will be removed after you have a follow-up visit with a specialist (gynecologist).  Marsupialization. The cyst or abscess is opened and kept open by stitching the edges of the skin to the walls of the cyst or abscess. This allows it to continue to drain and not fill up with fluid again. If you have cysts or abscesses that keep returning and have required incision and drainage multiple times, your health care provider may talk to you about surgery to remove the Bartholin gland. Follow these instructions at home:  Take medicines only as directed by your health care provider.  If you were prescribed an antibiotic medicine, finish it all even if you start to feel better.  Apply warm, wet compresses to the area or take warm, shallow baths that cover your pelvic region (  sitz baths) several times a day or as directed by your health care provider.  Do not squeeze the cyst or apply heavy pressure to it.  Do not have sexual intercourse until the cyst has gone away.  If your cyst or abscess was opened, a small piece of gauze or a drain may have been placed in the area to allow drainage. Do not remove the gauze or the drain until directed by your health care provider.  Wear feminine pads-not tampons-as needed for any drainage or bleeding.  Keep all follow-up visits as directed by your  health care provider. This is important. How is this prevented? Take these steps to help prevent a Bartholin cyst from returning:  Practice good hygiene.  Clean your vaginal area with mild soap and a soft cloth when you bathe.  Practice safe sex to prevent STDs. Contact a health care provider if:  You have increased pain, swelling, or redness in the area of the cyst.  Puslike drainage is coming from the cyst.  You have a fever. This information is not intended to replace advice given to you by your health care provider. Make sure you discuss any questions you have with your health care provider. Document Released: 03/15/2005 Document Revised: 08/21/2015 Document Reviewed: 10/29/2013 Elsevier Interactive Patient Education  2017 [employer]   Bacterial Vaginosis Bacterial vaginosis is an infection of the vagina. It happens when too many germs (bacteria) grow in the vagina. This infection puts you at risk for infections from sex (STIs). Treating this infection can lower your risk for some STIs. You should also treat this if you are pregnant. It can cause your baby to be born early. Follow these instructions at home: Medicines   Take over-the-counter and prescription medicines only as told by your doctor.  Take or use your antibiotic medicine as told by your doctor. Do not stop taking or using it even if you start to feel better. General instructions   If you your sexual partner is a woman, tell her that you have this infection. She needs to get treatment if she has symptoms. If you have a female partner, he does not need to be treated.  During treatment:  Avoid sex.  Do not douche.  Avoid alcohol as told.  Avoid breastfeeding as told.  Drink enough fluid to keep your pee (urine) clear or pale yellow.  Keep your vagina and butt (rectum) clean.  Wash the area with warm water every day.  Wipe from front to back after you use the toilet.  Keep all follow-up visits as told  by your doctor. This is important. Preventing this condition   Do not douche.  Use only warm water to wash around your vagina.  Use protection when you have sex. This includes:  Latex condoms.  Dental dams.  Limit how many people you have sex with. It is best to only have sex with the same person (be monogamous).  Get tested for STIs. Have your partner get tested.  Wear underwear that is cotton or lined with cotton.  Avoid tight pants and pantyhose. This is most important in summer.  Do not use any products that have nicotine or tobacco in them. These include cigarettes and e-cigarettes. If you need help quitting, ask your doctor.  Do not use illegal drugs.  Limit how much alcohol you drink. Contact a doctor if:  Your symptoms do not get better, even after you are treated.  You have more discharge or pain when you  pee (urinate).  You have a fever.  You have pain in your belly (abdomen).  You have pain with sex.  Your bleed from your vagina between periods. Summary  This infection happens when too many germs (bacteria) grow in the vagina.  Treating this condition can lower your risk for some infections from sex (STIs).  You should also treat this if you are pregnant. It can cause early (premature) birth.  Do not stop taking or using your antibiotic medicine even if you start to feel better. This information is not intended to replace advice given to you by your health care provider. Make sure you discuss any questions you have with your health care provider. Document Released: 12/23/2007 Document Revised: 11/29/2015 Document Reviewed: 11/29/2015 Elsevier Interactive Patient Education  2017 [employer].

## 2016-08-19 NOTE — Progress Notes (Signed)
   Subjective:   Patient ID: Amber Tyler, female    DOB: 1995/06/17, 21 y.o.   MRN: [id number]  Patient presents for Same Day Appointment  Chief Complaint  Patient presents with  . Vaginal Pain    HPI: # Vaginal Pain For the last few months; has h/o dysmenorrhea Tampons hurt to insert recently  Occasional spotting Noticed yesterday lump on right on inside of vagina Super painful to touch Never had before Has h/o ovarian cyst Having increased clear watery discharge No odor No irritation No fevers or abdominal pain  Review of Systems   See HPI for ROS.   History  Smoking Status  . Never Smoker  Smokeless Tobacco  . Never Used    Past medical history, surgical, family, and social history reviewed and updated in the EMR as appropriate.  Pertinent Historical Findings include: asthma, bipolar, fibromyalgia, menorrhagia Objective:  BP 120/78   Pulse 84   Temp 98.4 F (36.9 C) (Oral)   Wt 184 lb (83.5 kg)   LMP 08/13/2016 (Approximate)   SpO2 99%   BMI 30.62 kg/m  Vitals and nursing note reviewed  Physical Exam  Constitutional: She is well-developed, well-nourished, and in no distress.  Cardiovascular: Normal rate.   Pulmonary/Chest: Effort normal.  Abdominal: Soft. She exhibits no distension. There is no tenderness.  Genitourinary: Vagina normal, cervix normal, right adnexa normal and left adnexa normal. Cervix exhibits no motion tenderness. Right adnexum displays no mass and no tenderness. Left adnexum displays no mass and no tenderness. No vaginal discharge found.  Genitourinary Comments: Small pinpoint mass along the right inner labia minora. No drainage or erythema. Mildly tender to palpation.   Neurological: She is alert.   Wet prep with clue cells, bacteria and negative whiff  Assessment & Plan:  1. Bartholin cyst Lesion and symptoms consistent with bartholin cyst. No signs of infection. Watery discharge could be due to rupture of cyst. Not large enough  to I&D. Will monitor.   2. Vaginal discharge Wet prep with signs of BV but not really symtpomatic. Will treat with Flagyl.  - POCT Wet Prep [facility])  3. Screening for malignant neoplasm of cervix Pap smear performed. Will contact about results.  - Cytology - [facility]  Diagnosis and plan along with any newly prescribed medication(s) were discussed in detail with this patient today. The patient verbalized understanding and agreed with the plan. Patient advised if symptoms worsen return to clinic.   PATIENT EDUCATION PROVIDED: See AVS   Luiz Blare, DO 08/19/2016, 3:15 PM PGY-3, [place]

## 2016-08-24 LAB — CYTOLOGY - PAP: Diagnosis: NEGATIVE

## 2016-08-25 ENCOUNTER — Telehealth: Payer: Self-pay | Admitting: *Deleted

## 2016-08-25 NOTE — Telephone Encounter (Signed)
-----   Message from Katheren Shams, DO sent at 08/25/2016 11:47 AM EDT ----- [facility] team, please call pt and let her know that her pap smear was normal. Next pap is in 3 years.   Thanks! Katheren Shams, DO

## 2016-08-25 NOTE — Telephone Encounter (Signed)
LM for patient to call back. Adis Sturgill,CMA  

## 2016-08-26 NOTE — Telephone Encounter (Signed)
Patient is aware of results. Brandolyn Shortridge,CMA  

## 2016-08-31 ENCOUNTER — Ambulatory Visit (INDEPENDENT_AMBULATORY_CARE_PROVIDER_SITE_OTHER): Payer: BLUE CROSS/BLUE SHIELD | Admitting: Obstetrics and Gynecology

## 2016-08-31 ENCOUNTER — Encounter: Payer: Self-pay | Admitting: Obstetrics and Gynecology

## 2016-08-31 VITALS — BP 118/80 | HR 115 | Temp 98.1°F | Wt 185.0 lb

## 2016-08-31 DIAGNOSIS — Z975 Presence of (intrauterine) contraceptive device: Secondary | ICD-10-CM | POA: Insufficient documentation

## 2016-08-31 LAB — POCT URINE PREGNANCY: Preg Test, Ur: NEGATIVE

## 2016-08-31 MED ORDER — LEVONORGESTREL 20 MCG/24HR IU IUD
INTRAUTERINE_SYSTEM | Freq: Once | INTRAUTERINE | Status: AC
  Administered 2016-08-31: 1 via INTRAUTERINE

## 2016-08-31 NOTE — Addendum Note (Signed)
Addended by: Christen Bame D on: 08/31/2016 05:06 PM   Modules accepted: Orders

## 2016-08-31 NOTE — Patient Instructions (Signed)

## 2016-08-31 NOTE — Progress Notes (Signed)
Placement of Mirena IUD  Indication: Contraception Assisted by: CMA  Patient was counseled regarding the risks/benefits/alternatives of the IUD. Urine pregnancy test negative . Consent was obtained and all questions answered.  Time out performed.   Description: Cervix was swabbed three times with Betadine swabs. Sterile gloves donned. Sterile single-tooth tenaculum used to grasp anterior lip of the cervix and straighten the endocervical canal. Uterus sounded to 7 cm. IUD loaded per manufacturer's instruction and flange set.  IUD placed per manufacturer's directions. Strings trimmed. Patient tolerated the procedure well. No complications.    Follow-up: Patient counseled regarding techniques for self-monitoring of IUD and given precautions. Patient notified of removal date and given card. Patient will follow-up in 4 weeks for string check.

## 2016-08-31 NOTE — Progress Notes (Signed)
     Subjective: Chief Complaint  Patient presents with  . Contraception     HPI: Amber Tyler is a 21 y.o. presenting to clinic today for IUD placement.   Was counseled last visit and patient wants IUD to help with her menorrhagia and dysmenorrhea. Denies abdominal pain, vaginal discharge.   ROS noted in HPI.   Past Medical, Surgical, Social, and Family History Reviewed & Updated per EMR.    History  Smoking Status  . Never Smoker  Smokeless Tobacco  . Never Used    Objective: BP 118/80   Pulse (!) 115   Temp 98.1 F (36.7 C) (Oral)   Wt 185 lb (83.9 kg)   LMP 08/13/2016 (Approximate)   SpO2 98%   BMI 30.79 kg/m  Vitals and nursing notes reviewed  Physical Exam  Constitutional: She is well-developed, well-nourished, and in no distress.  Neck: Normal range of motion.  Cardiovascular: Normal rate.   Pulmonary/Chest: Effort normal.  Abdominal: Soft. There is no tenderness.  Neurological: She is alert.  Psychiatric: Mood and affect normal.    Results for orders placed or performed in visit on 08/31/16 (from the past 72 hour(s))  POCT urine pregnancy     Status: None   Collection Time: 08/31/16  4:20 PM  Result Value Ref Range   Preg Test, Ur Negative Negative    Assessment/Plan: 1. IUD contraception IUD placed. See procedure note. No complications.  - POCT urine pregnancy  PATIENT EDUCATION PROVIDED: See AVS     Orders Placed This Encounter  Procedures  . POCT urine pregnancy    Luiz Blare, DO 08/31/2016, 4:33 PM PGY-3, [place]

## 2016-09-01 ENCOUNTER — Ambulatory Visit: Payer: BLUE CROSS/BLUE SHIELD | Admitting: Family Medicine

## 2016-09-22 ENCOUNTER — Telehealth: Payer: Self-pay | Admitting: Family Medicine

## 2016-09-22 NOTE — Telephone Encounter (Signed)
Routing to doctor that inserted the IUD to be advised. Katharina Caper, Shikara Mcauliffe D, [place]

## 2016-09-22 NOTE — Telephone Encounter (Signed)
Called to discuss with patient. She is still having regular period flow with the IUD. She is not saturating multiple tampons or having pain. Just having cramping. She thought she should be spotting by now. Reassured her and gave her warning signs to seek help. Not symptomatic of anemia. She is to schedule a 4 week follow-up for next week anyway since she is due for string check. Will reassess then.

## 2016-09-22 NOTE — Telephone Encounter (Signed)
Pt called because she is still bleeding very heavy after having the IUD inserted. Please call jw

## 2016-10-19 ENCOUNTER — Ambulatory Visit (INDEPENDENT_AMBULATORY_CARE_PROVIDER_SITE_OTHER): Payer: BLUE CROSS/BLUE SHIELD | Admitting: Internal Medicine

## 2016-10-19 ENCOUNTER — Encounter: Payer: Self-pay | Admitting: Internal Medicine

## 2016-10-19 VITALS — BP 120/74 | HR 100 | Temp 98.5°F | Wt 192.0 lb

## 2016-10-19 DIAGNOSIS — F329 Major depressive disorder, single episode, unspecified: Secondary | ICD-10-CM | POA: Diagnosis not present

## 2016-10-19 DIAGNOSIS — H9201 Otalgia, right ear: Secondary | ICD-10-CM | POA: Diagnosis not present

## 2016-10-19 DIAGNOSIS — F32A Depression, unspecified: Secondary | ICD-10-CM

## 2016-10-19 MED ORDER — NORTRIPTYLINE HCL 10 MG PO CAPS: 10.0000 mg | ORAL_CAPSULE | Freq: Every day | ORAL | 0 refills | Status: AC

## 2016-10-19 MED ORDER — FLUTICASONE PROPIONATE 50 MCG/ACT NA SUSP: 2.0000 | Freq: Every day | NASAL | 0 refills | Status: DC

## 2016-10-19 NOTE — Assessment & Plan Note (Signed)
Uncontrolled. Worsening over the last year. - Continue Cymbalta 60 mg daily - Refill sent for nortriptyline daily at bedtime - Discussed different options with patient and she has decided to be seen in the mood disorders clinic. Patient given Dr. Tod Persia card.  - Discussed also sending her to a therapist. Patient would like to start with mood disorders clinic for now and will let me know if she is interested in starting therapy in the future. Dr. Gwenlyn Saran recommended [place].

## 2016-10-19 NOTE — Assessment & Plan Note (Signed)
Likely eustachian tube dysfunction in the setting of sinus inflammation. - Flonase 2 sprays per nostril daily - Follow-up if no improvement

## 2016-10-19 NOTE — Patient Instructions (Signed)
It was so nice to see you!  Please call Dr. Gwenlyn Saran as soon as possible to set up an appointment for mood clinic.   I have also sent in a prescription for Flonase. Please use 2 sprays in each nostril daily until your ear pain gets better.  -Dr. Brett Albino

## 2016-10-19 NOTE — Progress Notes (Addendum)
   [facility] Phone: [phone]  Subjective:  Amber Tyler is a 21 year old female presenting clinic with depression and right ear pain.  Depression: Worsening depression over the last year. She feels that her worsening depression is related to increasing pain due to her fibromyalgia. She has been on Cymbalta for the last 3 years. She feels like the Cymbalta helped her a lot at first, but has not felt that the Cymbalta has helped over the past year. She was seen in clinic on 04/23/16 and was prescribed nortriptyline in addition to the Cymbalta. She never picked up the nortriptyline because she is having many issues at that time. She thinks that her depression is worse recently because she lost one of her coworkers in a fire in March. She works as a Personnel officer. She denies any SI/HI. She has not seen a therapist since she was a teenager. She is interested in reestablishing with the therapist. Her main symptoms include little interest in doing things and difficulties with concentration.   Right Ear Pain: Has been going on for 1.5 months. Not having much congestion or runny nose, but states that she had an asthma exacerbation recently so she thinks that she may have allergies. No fevers, no chills. No changes in hearing. Feels like her ear is "clogged".  ROS: See HPI for pertinent positives and negatives  Past Medical History- asthma, bipolar disorder, depression, anxiety, fibromyalgia  Family history reviewed for today's visit. No changes.  Social history- patient is a never smoker  Objective: BP 120/74   Pulse 100   Temp 98.5 F (36.9 C) (Oral)   Wt 192 lb (87.1 kg)   LMP 10/05/2016   SpO2 99%   BMI 31.95 kg/m  Gen: Tired-appearing, in NAD HEENT: [place]/AT, TMs normal bilaterally, nasal turbinates edematous, MMM Psych: Speaking somewhat slowly and quietly, normal thought content, normal judgment.   PHQ-9: 20  Assessment/Plan: Depression: Uncontrolled. Worsening  over the last year. PHQ-9 is a 20 today. - Continue Cymbalta 60 mg daily - Refill sent for nortriptyline daily at bedtime - Discussed different options with patient and she has decided to be seen in the mood disorders clinic. Patient given Dr. Tod Persia card.  - Discussed also sending her to a therapist. Patient would like to start with mood disorders clinic for now and will let me know if she is interested in starting therapy in the future. Dr. Gwenlyn Saran recommended [place].  Otalagia, right: Likely eustachian tube dysfunction in the setting of sinus inflammation. - Flonase 2 sprays per nostril daily - Follow-up if no improvement   Hyman Bible, MD PGY-3

## 2016-10-23 ENCOUNTER — Encounter (HOSPITAL_BASED_OUTPATIENT_CLINIC_OR_DEPARTMENT_OTHER): Payer: Self-pay

## 2016-10-23 ENCOUNTER — Emergency Department (HOSPITAL_BASED_OUTPATIENT_CLINIC_OR_DEPARTMENT_OTHER)
Admission: EM | Admit: 2016-10-23 | Discharge: 2016-10-23 | Disposition: A | Discharge status: Home / Self Care | Payer: BLUE CROSS/BLUE SHIELD | Attending: Emergency Medicine | Admitting: Emergency Medicine

## 2016-10-23 ENCOUNTER — Emergency Department (HOSPITAL_BASED_OUTPATIENT_CLINIC_OR_DEPARTMENT_OTHER): Payer: BLUE CROSS/BLUE SHIELD

## 2016-10-23 DIAGNOSIS — J45909 Unspecified asthma, uncomplicated: Secondary | ICD-10-CM | POA: Diagnosis not present

## 2016-10-23 DIAGNOSIS — Z79899 Other long term (current) drug therapy: Secondary | ICD-10-CM | POA: Diagnosis not present

## 2016-10-23 DIAGNOSIS — R55 Syncope and collapse: Secondary | ICD-10-CM | POA: Diagnosis not present

## 2016-10-23 DIAGNOSIS — R112 Nausea with vomiting, unspecified: Secondary | ICD-10-CM | POA: Insufficient documentation

## 2016-10-23 LAB — CBC WITH DIFFERENTIAL/PLATELET
Basophils Absolute: 0 10*3/uL (ref 0.0–0.1)
Basophils Relative: 0 %
Eosinophils Absolute: 0.2 10*3/uL (ref 0.0–0.7)
Eosinophils Relative: 1 %
HCT: 38.6 % (ref 36.0–46.0)
Hemoglobin: 13.1 g/dL (ref 12.0–15.0)
Lymphocytes Relative: 29 %
Lymphs Abs: 4.4 10*3/uL — ABNORMAL HIGH (ref 0.7–4.0)
MCH: 29.2 pg (ref 26.0–34.0)
MCHC: 33.9 g/dL (ref 30.0–36.0)
MCV: 86 fL (ref 78.0–100.0)
Monocytes Absolute: 1 10*3/uL (ref 0.1–1.0)
Monocytes Relative: 6 %
Neutro Abs: 9.5 10*3/uL — ABNORMAL HIGH (ref 1.7–7.7)
Neutrophils Relative %: 64 %
Platelets: 424 10*3/uL — ABNORMAL HIGH (ref 150–400)
RBC: 4.49 MIL/uL (ref 3.87–5.11)
RDW: 13.3 % (ref 11.5–15.5)
WBC: 15.1 10*3/uL — ABNORMAL HIGH (ref 4.0–10.5)

## 2016-10-23 LAB — URINALYSIS, ROUTINE W REFLEX MICROSCOPIC
Bilirubin Urine: NEGATIVE
Glucose, UA: NEGATIVE mg/dL
Hgb urine dipstick: NEGATIVE
Ketones, ur: NEGATIVE mg/dL
Leukocytes, UA: NEGATIVE
Nitrite: NEGATIVE
Protein, ur: NEGATIVE mg/dL
Specific Gravity, Urine: 1.017 (ref 1.005–1.030)
pH: 7.5 (ref 5.0–8.0)

## 2016-10-23 LAB — BASIC METABOLIC PANEL
Anion gap: 8 (ref 5–15)
BUN: 10 mg/dL (ref 6–20)
CO2: 29 mmol/L (ref 22–32)
Calcium: 8.8 mg/dL — ABNORMAL LOW (ref 8.9–10.3)
Chloride: 99 mmol/L — ABNORMAL LOW (ref 101–111)
Creatinine, Ser: 0.89 mg/dL (ref 0.44–1.00)
GFR calc Af Amer: >60 mL/min (ref >60)
GFR calc non Af Amer: >60 mL/min (ref >60)
Glucose, Bld: 98 mg/dL (ref 65–99)
Potassium: 3.7 mmol/L (ref 3.5–5.1)
Sodium: 136 mmol/L (ref 135–145)

## 2016-10-23 LAB — TROPONIN I: Troponin I: <0.03 ng/mL (ref <0.03)

## 2016-10-23 LAB — PREGNANCY, URINE: Preg Test, Ur: NEGATIVE

## 2016-10-23 MED ORDER — METOCLOPRAMIDE HCL 5 MG/ML IJ SOLN
10.0000 mg | Freq: Once | INTRAMUSCULAR | Status: AC
  Administered 2016-10-23: 10 mg via INTRAVENOUS
  Filled 2016-10-23: qty 2

## 2016-10-23 MED ORDER — PROMETHAZINE HCL 25 MG PO TABS: 25.0000 mg | ORAL_TABLET | Freq: Four times a day (QID) | ORAL | 0 refills | Status: DC | PRN

## 2016-10-23 MED ORDER — SODIUM CHLORIDE 0.9 % IV BOLUS (SEPSIS)
1000.0000 mL | Freq: Once | INTRAVENOUS | Status: AC
  Administered 2016-10-23: 1000 mL via INTRAVENOUS

## 2016-10-23 NOTE — Discharge Instructions (Signed)
Workup has been reassuring in the ED. Make sure drink plenty of fluids. Make sure to follow up with her primary care doctor on Monday and will need follow-up with a cardiologist next week. Return to the ED if he developed any worsening symptoms.

## 2016-10-23 NOTE — ED Notes (Signed)
Patient tolerated PO well.  Denies nausea or vomiting.

## 2016-10-23 NOTE — ED Triage Notes (Signed)
Pt states she has POTS, reports 4 syncopal episodes in the last 12 hours, associated n/v.

## 2016-10-23 NOTE — ED Notes (Addendum)
Pt claimed that she was diagnosed with POTS (Postural Orthostatic Tachycardia Syndrome) 2 years ago.  She passed out 4 times today.  She has been vomiting since last night.

## 2016-10-23 NOTE — ED Notes (Signed)
ED Provider at bedside. 

## 2016-10-23 NOTE — ED Notes (Signed)
Pt asking for nausea medication, RN made aware.

## 2016-10-23 NOTE — ED Notes (Signed)
Pt given ginger ale and saltines.  

## 2016-10-26 ENCOUNTER — Telehealth: Payer: Self-pay | Admitting: *Deleted

## 2016-10-26 NOTE — Telephone Encounter (Signed)
Patient's spouse Rodena Piety called wanting to speak with Dr. Gwenlyn Saran. They were referred by Dr. Brett Albino last week for depression.  Today they were watching [employer] and a little boy passed away.  The passing of the little boy brought back memory of patent's grandmother passing.  Patient has been crying a lot. Rodena Piety stated patient was calm for now, but took her a while.  Patient does not want to harm herself or others.  They were wanting an appointment with Dr. Gwenlyn Saran in the AM, informed that Dr. Gwenlyn Saran usually schedules her patients.  No [place] in clinic at this time.  Advised if patient really needed to speak with someone right now, to take her to [facility] ED.  Rodena Piety stated understanding.  They will wait to until the morning and call for an appointment with Dr. Gwenlyn Saran or have Dr. Gwenlyn Saran call the patient at [phone].  Derl Barrow, RN

## 2016-10-27 NOTE — Telephone Encounter (Signed)
Saw note below from [place].  Patient had also called and left a VM per her conversation with Dr. Brett Albino.  Wished to schedule for [facility].  Did not wish to schedule with therapy at this time.  She is on vacation for our first available [facility] appointment so was not able to schedule her until 9/19 a 10:00.  Since [facility] appointment is so far away, I will reach out to the patient in one week to make sure she is maintaining (given Tamika's conversation).

## 2016-10-29 NOTE — ED Provider Notes (Signed)
[facility] DEPT Provider Note   CSN: [id number] Arrival date & time: 10/23/16  1254     History   Chief Complaint Chief Complaint  Patient presents with  . Loss of Consciousness    HPI Amber Tyler is a 21 y.o. female.  HPI 21 year old Caucasian female past medical history significant for asthma, fibromyalgia, pots syndrome presents to the ED today with complaints of for syncopal episodes in the last 12 hours. She also reports associated nausea and vomiting. Patient states that she has a history of pots and has syncopal episodes at baseline. However she had 4 episodes today which is not normal for her which is why she came to the ED for evaluation. Patient has been seen in the ED for same. The patient states that she was at work today when she had a syncopal episode which is why she came to the ED for evaluation. Denies head injury was witnessed by coworkers. 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 Patient denies any other associated symptoms including fever, chills, lightheadedness, dizziness, headache, vision changes, chest pain, shortness of breath, abdominal pain, urinary symptoms, vaginal symptoms, change in bowel habits, paresthesias.  Patient is followed by her cardiologist for her pots. She also sees a psychiatrist for bipolar disorder and depression. Past Medical History:  Diagnosis Date  . Asthma   . Fibromyalgia   . POTS (postural orthostatic tachycardia syndrome)     Patient Active Problem List   Diagnosis Date Noted  . Otalgia of right ear 10/19/2016  . IUD contraception 08/31/2016  . Fibromyalgia 04/25/2016  . Rash and nonspecific skin eruption 04/25/2016  . Menorrhagia 04/25/2016  . Adjustment disorder with mixed anxiety and depressed mood 02/29/2016  . RUQ pain   . Leukocytosis   .  Awareness of heartbeats 12/01/2014  . Thrombocytosis ([place]) 11/19/2013  . Other malaise and fatigue 11/15/2013  . Frequent headaches 04/05/2013  . Dysmenorrhea 05/05/2012  . Concussion 02/29/2012  . Bipolar disorder ([place]) 11/17/2011  . Overdose of antidepressant 11/16/2011  . Asthma 07/21/2010  . Depression 11/17/2009    Past Surgical History:  Procedure Laterality Date  . ADENOIDECTOMY    . ANKLE SURGERY Right   . internal heart monitor    . KNEE SURGERY    . TONSILLECTOMY      OB History    No data available       Home Medications    Prior to Admission medications   Medication Sig Start Date End Date Taking? Authorizing Provider  DULoxetine (CYMBALTA) 60 MG capsule Take 60 mg by mouth daily.   Yes [provider]  busPIRone (BUSPAR) 7.5 MG tablet Take 1 tablet (7.5 mg total) by mouth 2 (two) times daily. 03/24/16   Dickie La, MD  fluticasone (FLONASE) 50 MCG/ACT nasal spray Place 2 sprays into both nostrils daily. 10/19/16   Mayo, Pete Pelt, MD  norgestimate-ethinyl estradiol (ORTHO-CYCLEN,SPRINTEC,PREVIFEM) 0.25-35 MG-MCG tablet Take 1 tablet by mouth daily. 05/13/16   Dickie La, MD  nortriptyline (PAMELOR) 10 MG capsule Take 1 capsule (10 mg total) by mouth at bedtime. 10/19/16   Mayo, Pete Pelt, MD  promethazine (PHENERGAN) 25 MG tablet Take 1 tablet (25 mg total) by mouth every 6 (six) hours as needed for nausea or vomiting. 10/23/16   Doristine Devoid, PA-C  Family History Family History  Problem Relation Age of Onset  . Diabetes Neg Hx     Social History Social History  Substance Use Topics  . Smoking status: Never Smoker  . Smokeless tobacco: Never Used  . Alcohol use Yes     Comment: occ     Allergies   Ondansetron hcl; Sulfonamide derivatives; and Tape   Review of Systems Review of Systems  Constitutional: Negative for chills and fever.  HENT: Negative for congestion.   Eyes: Negative for visual disturbance.  Respiratory:  Negative for cough and shortness of breath.   Cardiovascular: Negative for chest pain.  Gastrointestinal: Positive for nausea and vomiting. Negative for abdominal pain and diarrhea.  Genitourinary: Negative for dysuria, flank pain, frequency, hematuria, urgency, vaginal bleeding and vaginal discharge.  Musculoskeletal: Negative for arthralgias and myalgias.  Skin: Negative for rash.  Neurological: Positive for syncope. Negative for dizziness, weakness, light-headedness, numbness and headaches.  Psychiatric/Behavioral: Negative for sleep disturbance. The patient is not nervous/anxious.      Physical Exam Updated Vital Signs BP 104/67   Pulse 76   Temp 97.9 F (36.6 C) (Oral)   Resp 16   Ht 5\' 5"  (1.651 m)   Wt 87.1 kg (192 lb)   LMP 10/16/2016   SpO2 100%   BMI 31.95 kg/m   Physical Exam  Constitutional: She is oriented to person, place, and time. She appears well-developed and well-nourished.  Non-toxic appearance. No distress.  HENT:  Head: Normocephalic and atraumatic.  Nose: Nose normal.  Mouth/Throat: Oropharynx is clear and moist.  Eyes: Pupils are equal, round, and reactive to light. Conjunctivae are normal. Right eye exhibits no discharge. Left eye exhibits no discharge.  Neck: Normal range of motion. Neck supple.  Cardiovascular: Normal rate, regular rhythm, normal heart sounds and intact distal pulses.  Exam reveals no gallop and no friction rub.   No murmur heard. Pulmonary/Chest: Effort normal and breath sounds normal. No respiratory distress. She has no wheezes. She has no rales. She exhibits no tenderness.  Abdominal: Soft. Bowel sounds are normal. There is no tenderness. There is no rebound and no guarding.  Musculoskeletal: Normal range of motion. She exhibits no tenderness.  Lymphadenopathy:    She has no cervical adenopathy.  Neurological: She is alert and oriented to person, place, and time.  The patient is alert, attentive, and oriented x 3. Speech is  clear. Cranial nerve II-VII grossly intact. Negative pronator drift. Sensation intact. Strength 5/5 in all extremities. Reflexes 2+ and symmetric at biceps, triceps, knees, and ankles. Rapid alternating movement and fine finger movements intact. Romberg is absent. Posture and gait normal.   Skin: Skin is warm and dry. Capillary refill takes less than 2 seconds.  Psychiatric: Her behavior is normal. Judgment and thought content normal.  Nursing note and vitals reviewed.    ED Treatments / Results  Labs (all labs ordered are listed, but only abnormal results are displayed) Labs Reviewed  BASIC METABOLIC PANEL - Abnormal; Notable for the following:       Result Value   Chloride 99 (*)    Calcium 8.8 (*)    All other components within normal limits  CBC WITH DIFFERENTIAL/PLATELET - Abnormal; Notable for the following:    WBC 15.1 (*)    Platelets 424 (*)    Neutro Abs 9.5 (*)    Lymphs Abs 4.4 (*)    All other components within normal limits  URINALYSIS, ROUTINE W REFLEX MICROSCOPIC - Abnormal; Notable for the  following:    APPearance CLOUDY (*)    All other components within normal limits  TROPONIN I  PREGNANCY, URINE    EKG  EKG Interpretation  Date/Time:  Saturday October 23 2016 13:04:52 EDT Ventricular Rate:  74 PR Interval:    QRS Duration: 77 QT Interval:  400 QTC Calculation: 444 R Axis:   54 Text Interpretation:  Sinus rhythm Short PR interval No significant change since last tracing Confirmed by Addison Lank [phone]) on 10/23/2016 2:21:35 PM       Radiology No results found.  Procedures Procedures (including critical care time)  Medications Ordered in ED Medications  sodium chloride 0.9 % bolus 1,000 mL (0 mLs Intravenous Stopped 10/23/16 1415)  metoCLOPramide (REGLAN) injection 10 mg (10 mg Intravenous Given 10/23/16 1415)     Initial Impression / Assessment and Plan / ED Course  I have reviewed the triage vital signs and the nursing notes.  Pertinent  labs & imaging results that were available during my care of the patient were reviewed by me and considered in my medical decision making (see chart for details).     Patient presents to the ED with history of pots for 4 syncopal episodes last 12 hours. She also reports associated nausea and vomiting. Denies any head injury. Patient is overall well-appearing. Vital signs are reassuring. Patient is no focal neuro deficits. She reports that she is altered however patient is conscious alert and oriented 4 and able to follow commands appropriately. Syncopal workup was initiated. EKG shows no ischemic changes and is not changed from baseline. Patient with a leukocytosis of 15,000 but denies any other infectious complaints except for nausea and emesis. Urine shows no signs of infection. Electrolytes are baseline. Platelets mildly elevated possibly due to hemoconcentration. Patient given IV fluids and anti-medics in the ED. Able to tolerate by mouth fluids. Chest x-ray shows no acute abnormalities. Do not feel that imaging of the head is indicated at this time given normal neurological exam. Discussed this with patient and shared decision was making to avoid CT scan at this time. Patient was watched in the ED for several hours without any additional syncopal episodes. With discussed with my attending Dr. Carol Ada who is agreeable the above plan. Patient will need close follow-up with her PCP and her cardiologist. I have encouraged should return precautions if she develops any further syncopal episodes.  Pt is hemodynamically stable, in NAD, & able to ambulate in the ED. Evaluation does not show pathology that would require ongoing emergent intervention or inpatient treatment. I explained the diagnosis to the patient. Pain has been managed & has no complaints prior to dc. Pt is comfortable with above plan and is stable for discharge at this time. All questions were answered prior to disposition. Strict return  precautions for f/u to the ED were discussed. Encouraged follow up with PCP.   Final Clinical Impressions(s) / ED Diagnoses   Final diagnoses:  Syncope and collapse  Non-intractable vomiting with nausea, unspecified vomiting type    New Prescriptions Discharge Medication List as of 10/23/2016  3:58 PM    START taking these medications   Details  promethazine (PHENERGAN) 25 MG tablet Take 1 tablet (25 mg total) by mouth every 6 (six) hours as needed for nausea or vomiting., Starting Sat 10/23/2016, Print         Doristine Devoid, PA-C 10/29/16 0426    Fatima Blank, MD 10/30/16 0020

## 2016-11-05 ENCOUNTER — Telehealth: Payer: Self-pay | Admitting: Psychology

## 2016-11-05 NOTE — Telephone Encounter (Signed)
Thanks so much Dr. Gwenlyn Saran! You're the best!

## 2016-11-05 NOTE — Telephone Encounter (Signed)
Called patient to check-in. She is taking nortriptyline and might be noticing some minimal improvement.  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  No more episodes like she had recently while watching a television show.  Asked her to call me if things get appreciably worse.  Has appointment for 9/19 at 10:00 for [facility].

## 2016-11-08 ENCOUNTER — Telehealth: Payer: Self-pay | Admitting: Psychology

## 2016-11-08 NOTE — Telephone Encounter (Signed)
Had a cancellation and was able to schedule patient for 9/5.

## 2016-12-01 ENCOUNTER — Encounter: Payer: Self-pay | Admitting: Psychology

## 2016-12-01 ENCOUNTER — Other Ambulatory Visit: Payer: Self-pay | Admitting: Family Medicine

## 2016-12-01 ENCOUNTER — Ambulatory Visit (INDEPENDENT_AMBULATORY_CARE_PROVIDER_SITE_OTHER): Payer: BLUE CROSS/BLUE SHIELD | Admitting: Psychology

## 2016-12-01 DIAGNOSIS — F319 Bipolar disorder, unspecified: Secondary | ICD-10-CM

## 2016-12-01 DIAGNOSIS — Z79899 Other long term (current) drug therapy: Secondary | ICD-10-CM

## 2016-12-01 MED ORDER — LITHIUM CARBONATE 300 MG PO CAPS: 900.0000 mg | ORAL_CAPSULE | Freq: Every day | ORAL | 0 refills | Status: AC

## 2016-12-01 MED ORDER — LURASIDONE HCL 20 MG PO TABS: 20.0000 mg | ORAL_TABLET | Freq: Every day | ORAL | 5 refills | Status: AC

## 2016-12-01 NOTE — Progress Notes (Signed)
Presenting Issue: bipolar disorder w/ history of attempted suicide  Report of symptoms: depressed, not interested in activities, not fearing death, cutting with increased severity up to requiring stitches, held pistol to head 2 weeks ago and her wife had to take the gun from her.   Has short bursts of high energy and racing thoughts that are often not even a day long at a time.  Duration of CURRENT symptoms:described as worse since January '18 Age of onset of first mood disturbance:states first mood disturbance was ~21yo  Impact on function:states it is impacting her relationship with her wife but denies any impact on work.   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  States relationship impacts include affair with a man that she says her spouse knows about and that they have been working through it.  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  Psychiatric History - Diagnoses:bipolar 1, current episode depressed (diagnosed while in [place] as a teen) - Hospitalizations:  Twice for psychiatric holds (@15 ,21yrs old) - Pharmacotherapy: (prior to this visit) buspar, duloxetine, nortryptaline - Outpatient therapy: willing to establish care but has not recently had an outpatient therapist  Family history of psychiatric issues:paternal grandfather had an undiagnosed "break" and left family for many years.  Paternal uncle diagnosed with bipolar disorder.  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 GAD7:Not obtained.   -Dr. Criss Rosales

## 2016-12-01 NOTE — Progress Notes (Signed)
[place] in [place].

## 2016-12-01 NOTE — Patient Instructions (Signed)
It was good to see you today Hanny.  I appreciate Rodena Piety coming in as well.  This is a process.  We commit to our patients and hope that you too, can commit to the idea that this didn't start overnight and will not get better overnight.    Dialectical Behavior Therapy is a good treatment for you.  We also discussed medication.  Dr. Tammi Klippel recommended Lithium.  One 300 mg pill a day for three days.  Two pills for three days and then three pills a day (900 mg).  Always with food so you don't get nauseated.  We will need to get a Lithium level.    Latuda 20 mg must be taken with food in order to be properly absorbed.    Here is the link to the Jari Sportsman article:  www.nytimes.com/2009/09/18/health/23lives.html   The [facility] is a good place for information.  Google [facility].

## 2016-12-01 NOTE — Progress Notes (Signed)
Will begin 300 mg daily for 3 days, then 600 mg daily for 3 days, then 900 mg.  RTO 9/17 for OV with Dr. Criss Rosales and med check and BMP, TSH and Lithium level.

## 2016-12-03 NOTE — Assessment & Plan Note (Signed)
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    -She is scheduled to meet with Dr. Criss Rosales for followup in a few weeks to discuss safety, efficacy, and tolerability of her new medical regimen.  Labs at followup to include CMP, lithium level, and TSH -Patient denies alcohol and illicit drug use but staying abstinent from these was clearly discussed given her condition -Given patient's recent suicide attempt and suicidal ideation we discussed continuing safety plans already in place as her wife has removed firearms, medications, and knives from the home.  Patient has expressed desire to live for her brother who she describes as "my world" and says she feels optimistic about the treatment plan moving forward.   With her expressed hope for the future, close support of wife and roommate, and need to establish therapeutic relationship the decision was made that outpatient treatment would be appropriate.

## 2016-12-06 ENCOUNTER — Telehealth: Payer: Self-pay | Admitting: Psychology

## 2016-12-06 NOTE — Telephone Encounter (Signed)
Called patient to check in and to clarify a few things from her visit.  No answer.  I left a VM just asking her to return my call.  Goal is to ensure she stopped taking the nortriptyline and it was removed from the house and that she kept the Cymbalta the same for now.  She is scheduled to follow-up with Dr. Criss Rosales soon and [place] after in the [facility].

## 2016-12-07 NOTE — Telephone Encounter (Addendum)
Called patient again and connected.  I was on speaker phone with Amber Tyler with Amber Tyler in the room.  They report the nortriptyline was disposed of.  They also report she stopped taking the Cymbalta on Sunday and Amber Tyler's pain from fibromyalgia has been bad.  Per Dr. Tammi Klippel, I recommended she restart that with the idea that we will address that medication after we get the Lithium and Latuda on board and hopefully having a positive effect.  They voiced an understanding and were in agreement.  They reported that Amber Tyler is taking the Lithium and Latuda and both have noticed a positive effect.  No bothersome side effects except a mild tremor.  They will follow with Dr. Criss Rosales on Monday for a more thorough report.    Also provided resources for a therapist for DBT ([facility] or they can look on the [employer] website).

## 2016-12-13 ENCOUNTER — Ambulatory Visit: Payer: BLUE CROSS/BLUE SHIELD | Admitting: Family Medicine

## 2016-12-15 ENCOUNTER — Ambulatory Visit: Payer: BLUE CROSS/BLUE SHIELD | Admitting: Psychology

## 2016-12-18 ENCOUNTER — Encounter (HOSPITAL_BASED_OUTPATIENT_CLINIC_OR_DEPARTMENT_OTHER): Payer: Self-pay | Admitting: *Deleted

## 2016-12-18 DIAGNOSIS — K529 Noninfective gastroenteritis and colitis, unspecified: Secondary | ICD-10-CM | POA: Diagnosis not present

## 2016-12-18 DIAGNOSIS — J45909 Unspecified asthma, uncomplicated: Secondary | ICD-10-CM | POA: Diagnosis not present

## 2016-12-18 DIAGNOSIS — Z79899 Other long term (current) drug therapy: Secondary | ICD-10-CM | POA: Diagnosis not present

## 2016-12-18 DIAGNOSIS — R1011 Right upper quadrant pain: Secondary | ICD-10-CM | POA: Diagnosis present

## 2016-12-18 LAB — URINALYSIS, ROUTINE W REFLEX MICROSCOPIC
Bilirubin Urine: NEGATIVE
Glucose, UA: NEGATIVE mg/dL
Ketones, ur: NEGATIVE mg/dL
Leukocytes, UA: NEGATIVE
Nitrite: NEGATIVE
Protein, ur: NEGATIVE mg/dL
Specific Gravity, Urine: <1.005 — ABNORMAL LOW (ref 1.005–1.030)
pH: 6 (ref 5.0–8.0)

## 2016-12-18 LAB — URINALYSIS, MICROSCOPIC (REFLEX)

## 2016-12-18 LAB — PREGNANCY, URINE: Preg Test, Ur: NEGATIVE

## 2016-12-18 IMAGING — CR DG CHEST 2V
2 series · 2 of 2 positions shown · non-contrast
Comparison: Radiographs November 29, 2014.

CLINICAL DATA: Chest pain.

EXAM:
CHEST  2 VIEW

[w chest pa]
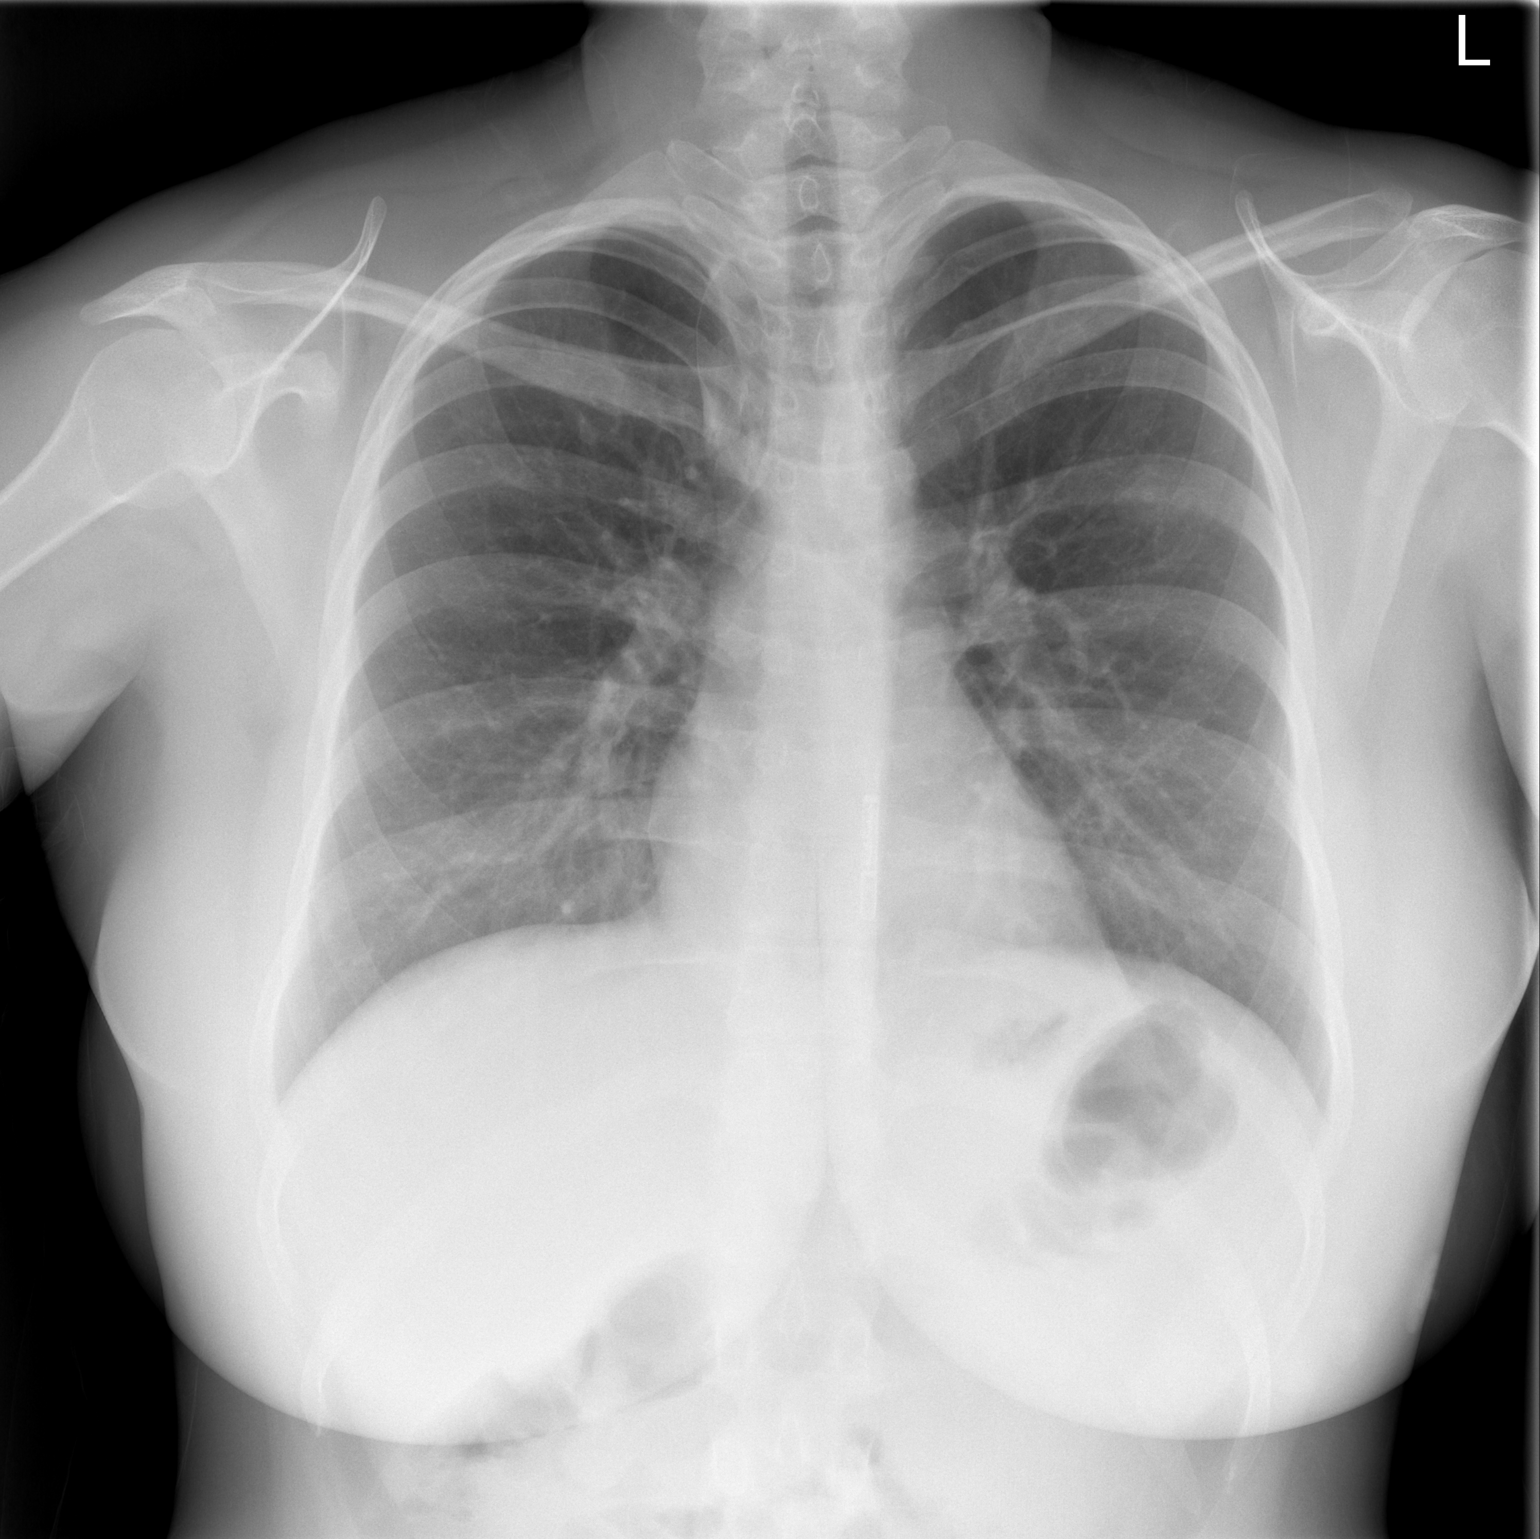

[w chest lat]
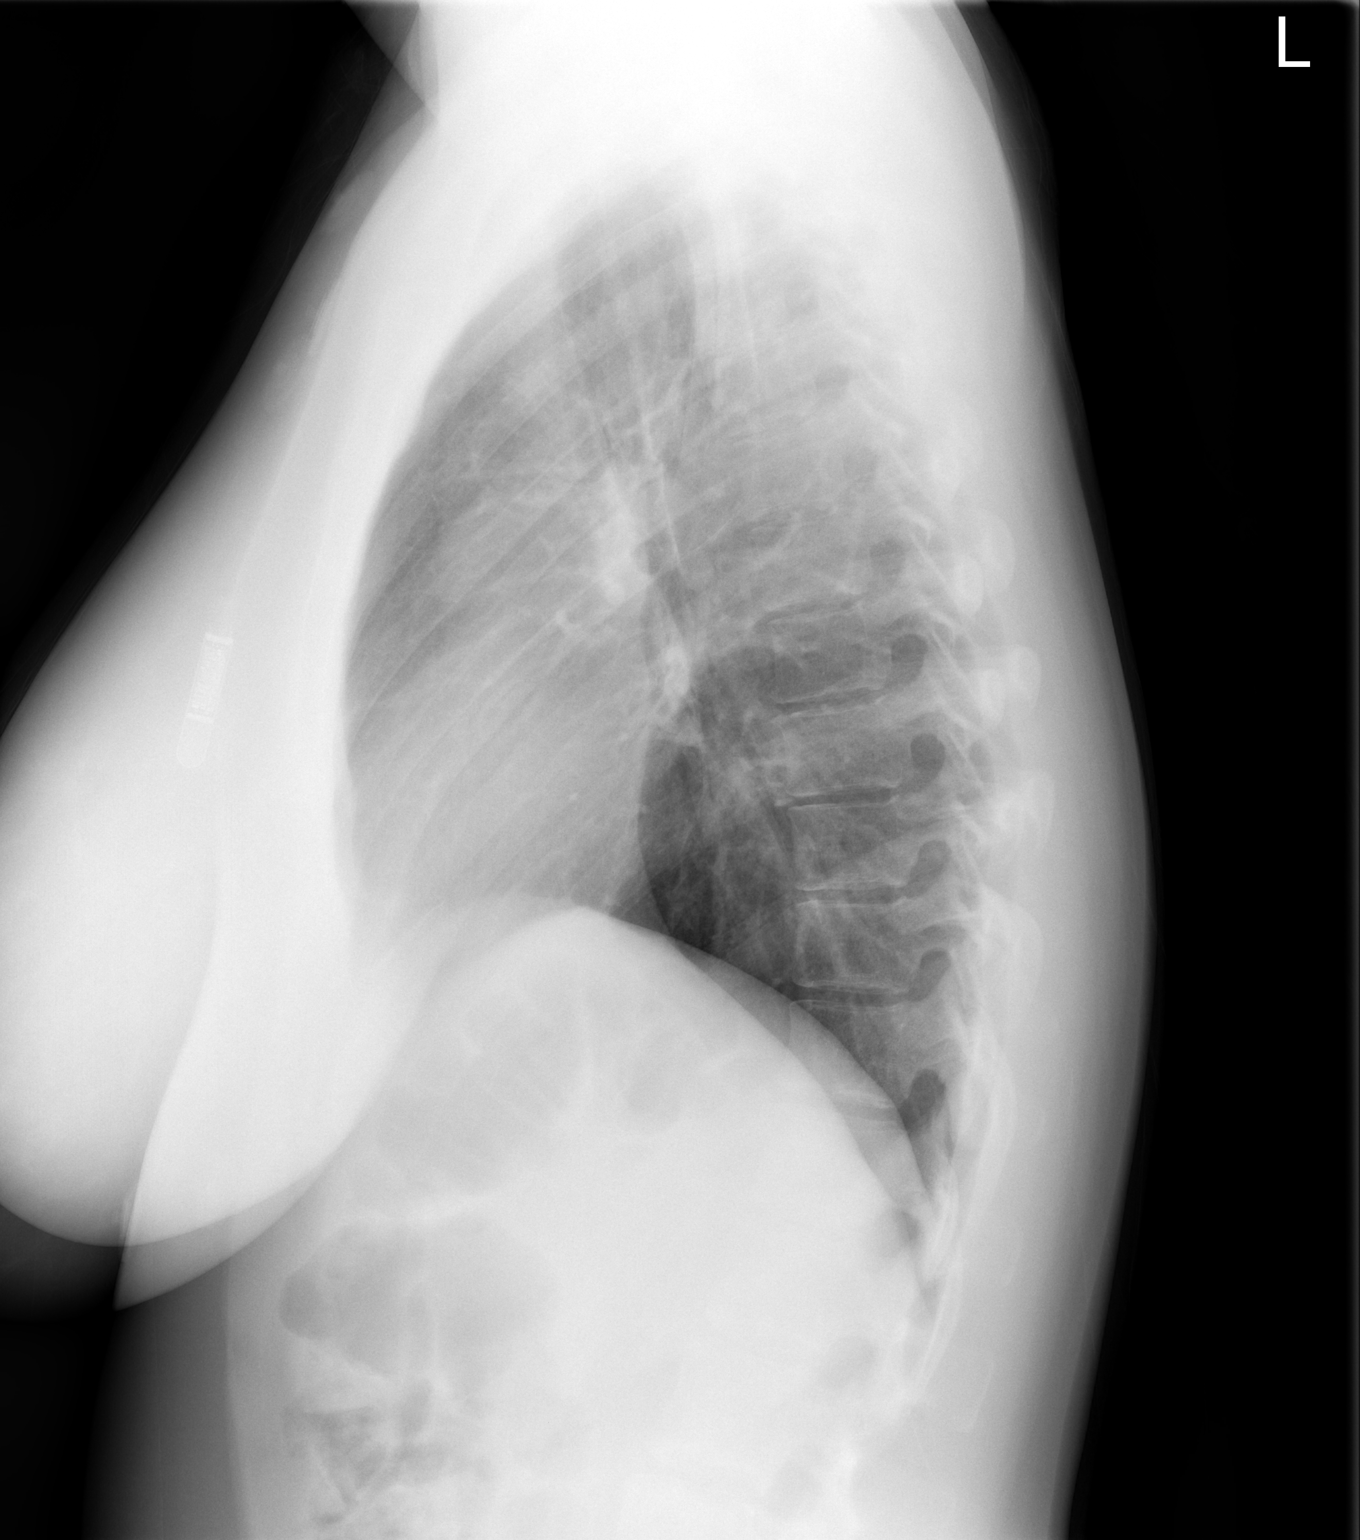

[2 of 2 positions shown; findings below may reference images not displayed]

FINDINGS: The heart size and mediastinal contours are within normal limits.
Both lungs are clear. No pneumothorax or pleural effusion is noted.
The visualized skeletal structures are unremarkable.
IMPRESSION: No active cardiopulmonary disease.

## 2016-12-18 NOTE — ED Triage Notes (Signed)
Pt reports she is having pain in the right upper abdomen that started about a week ago, has been coming and going, tonight worse and constant. Pt has had n/v/d and painful urination. Denies fevers. No meds PTA.

## 2016-12-19 ENCOUNTER — Emergency Department (HOSPITAL_BASED_OUTPATIENT_CLINIC_OR_DEPARTMENT_OTHER)
Admission: EM | Admit: 2016-12-19 | Discharge: 2016-12-19 | Disposition: A | Discharge status: Home / Self Care | Payer: BLUE CROSS/BLUE SHIELD | Attending: Emergency Medicine | Admitting: Emergency Medicine

## 2016-12-19 ENCOUNTER — Emergency Department (HOSPITAL_BASED_OUTPATIENT_CLINIC_OR_DEPARTMENT_OTHER): Payer: BLUE CROSS/BLUE SHIELD

## 2016-12-19 DIAGNOSIS — K529 Noninfective gastroenteritis and colitis, unspecified: Secondary | ICD-10-CM

## 2016-12-19 DIAGNOSIS — R1011 Right upper quadrant pain: Secondary | ICD-10-CM

## 2016-12-19 LAB — COMPREHENSIVE METABOLIC PANEL
ALT: 15 U/L (ref 14–54)
AST: 22 U/L (ref 15–41)
Albumin: 3.6 g/dL (ref 3.5–5.0)
Alkaline Phosphatase: 105 U/L (ref 38–126)
Anion gap: 7 (ref 5–15)
BUN: 8 mg/dL (ref 6–20)
CO2: 24 mmol/L (ref 22–32)
Calcium: 9 mg/dL (ref 8.9–10.3)
Chloride: 107 mmol/L (ref 101–111)
Creatinine, Ser: 0.72 mg/dL (ref 0.44–1.00)
GFR calc Af Amer: >60 mL/min (ref >60)
GFR calc non Af Amer: >60 mL/min (ref >60)
Glucose, Bld: 103 mg/dL — ABNORMAL HIGH (ref 65–99)
Potassium: 3.5 mmol/L (ref 3.5–5.1)
Sodium: 138 mmol/L (ref 135–145)
Total Bilirubin: 0.4 mg/dL (ref 0.3–1.2)
Total Protein: 7.3 g/dL (ref 6.5–8.1)

## 2016-12-19 LAB — CBC
HCT: 37.3 % (ref 36.0–46.0)
Hemoglobin: 12.4 g/dL (ref 12.0–15.0)
MCH: 28.1 pg (ref 26.0–34.0)
MCHC: 33.2 g/dL (ref 30.0–36.0)
MCV: 84.6 fL (ref 78.0–100.0)
Platelets: 384 10*3/uL (ref 150–400)
RBC: 4.41 MIL/uL (ref 3.87–5.11)
RDW: 13.6 % (ref 11.5–15.5)
WBC: 12.2 10*3/uL — ABNORMAL HIGH (ref 4.0–10.5)

## 2016-12-19 LAB — LIPASE, BLOOD: Lipase: 30 U/L (ref 11–51)

## 2016-12-19 MED ORDER — PROMETHAZINE HCL 25 MG/ML IJ SOLN
12.5000 mg | Freq: Once | INTRAMUSCULAR | Status: AC
  Administered 2016-12-19: 12.5 mg via INTRAVENOUS
  Filled 2016-12-19: qty 1

## 2016-12-19 MED ORDER — PROMETHAZINE HCL 25 MG PO TABS: 25.0000 mg | ORAL_TABLET | Freq: Four times a day (QID) | ORAL | 0 refills | Status: DC | PRN

## 2016-12-19 MED ORDER — HYDROCODONE-ACETAMINOPHEN 5-325 MG PO TABS: 1.0000 | ORAL_TABLET | Freq: Four times a day (QID) | ORAL | 0 refills | Status: DC | PRN

## 2016-12-19 MED ORDER — MORPHINE SULFATE (PF) 4 MG/ML IV SOLN
4.0000 mg | Freq: Once | INTRAVENOUS | Status: AC
  Administered 2016-12-19: 4 mg via INTRAVENOUS
  Filled 2016-12-19: qty 1

## 2016-12-19 MED ORDER — SODIUM CHLORIDE 0.9 % IV BOLUS (SEPSIS)
1000.0000 mL | Freq: Once | INTRAVENOUS | Status: AC
  Administered 2016-12-19: 1000 mL via INTRAVENOUS

## 2016-12-19 NOTE — ED Notes (Signed)
Patient transported to CT 

## 2016-12-19 NOTE — Discharge Instructions (Signed)
Phenergan as needed for nausea. Hydrocodone as prescribed as needed for pain.  Clear liquid diet for the next 12 hours, then slowly advance back to normal.  Follow-up with [facility] [place] [facility] if you're not improving in the next 2-3 days. Return to the emergency department if you develop worsening pain, high fevers, bloody stools, or other new and concerning symptoms.

## 2016-12-19 NOTE — ED Provider Notes (Signed)
[place] DEPT [facility] Provider Note   CSN: [id number] Arrival date & time: 12/18/16  2329     History   Chief Complaint Chief Complaint  Patient presents with  . Abdominal Pain    HPI Amber Tyler is a 21 y.o. female.  Patient is a 21 year old female with history of fibromyalgia, bipolar, POTS. She presents today for evaluation of right upper quadrant pain. She reports not being able to "keep anything down" for the past several days. She denies any fevers or chills. She denies any diarrhea or bloody stools. She has had similar episodes in the past. She has undergone extensive workup including CAT scans, ultrasound, however no definite cause is been found. She was told at one point her "gallbladder was slightly inflamed", however nothing was done about this.   The history is provided by the patient.  Abdominal Pain   This is a recurrent problem. Episode onset: 3 days ago. The problem occurs constantly. The problem has been gradually worsening. The pain is located in the RUQ. The quality of the pain is cramping. The pain is moderate. Associated symptoms include nausea and vomiting. Pertinent negatives include anorexia, fever and diarrhea. The symptoms are aggravated by palpation and eating. Nothing relieves the symptoms.    Past Medical History:  Diagnosis Date  . Asthma   . Fibromyalgia   . POTS (postural orthostatic tachycardia syndrome)     Patient Active Problem List   Diagnosis Date Noted  . Otalgia of right ear 10/19/2016  . IUD contraception 08/31/2016  . Fibromyalgia 04/25/2016  . Rash and nonspecific skin eruption 04/25/2016  . Menorrhagia 04/25/2016  . RUQ pain   . Leukocytosis   . Awareness of heartbeats 12/01/2014  . Thrombocytosis ([place]) 11/19/2013  . Other malaise and fatigue 11/15/2013  . Frequent headaches 04/05/2013  . Dysmenorrhea 05/05/2012  . Concussion 02/29/2012  . Bipolar I disorder, current episode depressed ([place]) 11/17/2011  . Overdose of  antidepressant 11/16/2011  . Asthma 07/21/2010    Past Surgical History:  Procedure Laterality Date  . ADENOIDECTOMY    . ANKLE SURGERY Right   . internal heart monitor    . KNEE SURGERY    . TONSILLECTOMY      OB History    No data available       Home Medications    Prior to Admission medications   Medication Sig Start Date End Date Taking? Authorizing Provider  busPIRone (BUSPAR) 7.5 MG tablet Take 1 tablet (7.5 mg total) by mouth 2 (two) times daily. 03/24/16   Dickie La, MD  DULoxetine (CYMBALTA) 60 MG capsule Take 60 mg by mouth daily.    [provider]  fluticasone (FLONASE) 50 MCG/ACT nasal spray Place 2 sprays into both nostrils daily. 10/19/16   Mayo, Pete Pelt, MD  lithium carbonate 300 MG capsule Take 3 capsules (900 mg total) by mouth daily with supper. 12/01/16   Vickii Penna, MD  lurasidone (LATUDA) 20 MG TABS tablet Take 1 tablet (20 mg total) by mouth daily with supper. 12/01/16   Vickii Penna, MD  norgestimate-ethinyl estradiol (ORTHO-CYCLEN,SPRINTEC,PREVIFEM) 0.25-35 MG-MCG tablet Take 1 tablet by mouth daily. 05/13/16   Dickie La, MD  nortriptyline (PAMELOR) 10 MG capsule Take 1 capsule (10 mg total) by mouth at bedtime. 10/19/16   Mayo, Pete Pelt, MD  promethazine (PHENERGAN) 25 MG tablet Take 1 tablet (25 mg total) by mouth every 6 (six) hours as needed for nausea or vomiting. 10/23/16   Ocie Cornfield  T, PA-C    Family History Family History  Problem Relation Age of Onset  . Diabetes Neg Hx     Social History Social History  Substance Use Topics  . Smoking status: Never Smoker  . Smokeless tobacco: Never Used  . Alcohol use Yes     Comment: occ     Allergies   Ondansetron hcl; Sulfonamide derivatives; and Tape   Review of Systems Review of Systems  Constitutional: Negative for fever.  Gastrointestinal: Positive for abdominal pain, nausea and vomiting. Negative for anorexia and diarrhea.  All other systems reviewed and  are negative.    Physical Exam Updated Vital Signs BP 112/78 (BP Location: Left Arm)   Pulse (!) 104   Temp 98.1 F (36.7 C) (Oral)   Resp 20   LMP 12/17/2016   SpO2 100%   Physical Exam  Constitutional: She is oriented to person, place, and time. She appears well-developed and well-nourished. No distress.  HENT:  Head: Normocephalic and atraumatic.  Neck: Normal range of motion. Neck supple.  Cardiovascular: Normal rate and regular rhythm.  Exam reveals no gallop and no friction rub.   No murmur heard. Pulmonary/Chest: Effort normal and breath sounds normal. No respiratory distress. She has no wheezes.  Abdominal: Soft. Bowel sounds are normal. She exhibits no distension. There is tenderness. There is no rebound and no guarding.  There is tenderness to palpation in the right upper quadrant.  Musculoskeletal: Normal range of motion.  Neurological: She is alert and oriented to person, place, and time.  Skin: Skin is warm and dry. She is not diaphoretic.  Nursing note and vitals reviewed.    ED Treatments / Results  Labs (all labs ordered are listed, but only abnormal results are displayed) Labs Reviewed  URINALYSIS, ROUTINE W REFLEX MICROSCOPIC - Abnormal; Notable for the following:       Result Value   Specific Gravity, Urine <1.005 (*)    Hgb urine dipstick MODERATE (*)    All other components within normal limits  URINALYSIS, MICROSCOPIC (REFLEX) - Abnormal; Notable for the following:    Bacteria, UA RARE (*)    Squamous Epithelial / LPF 0-5 (*)    All other components within normal limits  COMPREHENSIVE METABOLIC PANEL - Abnormal; Notable for the following:    Glucose, Bld 103 (*)    All other components within normal limits  CBC - Abnormal; Notable for the following:    WBC 12.2 (*)    All other components within normal limits  PREGNANCY, URINE  LIPASE, BLOOD    EKG  EKG Interpretation None       Radiology No results found.  Procedures Procedures  (including critical care time)  Medications Ordered in ED Medications  morphine 4 MG/ML injection 4 mg (not administered)  sodium chloride 0.9 % bolus 1,000 mL (not administered)  promethazine (PHENERGAN) injection 12.5 mg (not administered)     Initial Impression / Assessment and Plan / ED Course  I have reviewed the triage vital signs and the nursing notes.  Pertinent labs & imaging results that were available during my care of the patient were reviewed by me and considered in my medical decision making (see chart for details).  Patient presents with right-sided abdominal pain and vomiting. Her workup reveals a mild leukocytosis and CT scan shows dilated, inflamed loops of bowel consistent with enteritis. The patient is concerned about her gallbladder, however there is no distention, thickening, stranding, or pericholecystic fluid that would be consistent with  this. She has normal LFTs and no sign of stones.  She will be discharged with medicine for pain and nausea and advised to follow-up with enteral surgery if she is not improving in the next 2-3 days.  Final Clinical Impressions(s) / ED Diagnoses   Final diagnoses:  None    New Prescriptions New Prescriptions   No medications on file     Veryl Speak, MD 12/19/16 [phone]

## 2016-12-27 ENCOUNTER — Telehealth: Payer: Self-pay | Admitting: *Deleted

## 2016-12-27 NOTE — Telephone Encounter (Signed)
Amber Tyler wanted to let Dr. Gwenlyn Saran and Dr. Caprice Red know that she has been unable to "keep down 3 pills" of lithium.  She would like to have her levels drawn.  She is aware she missed her last appt and was reminded of appt this Wednesday. Jemar Paulsen, Salome Spotted, CMA

## 2016-12-29 ENCOUNTER — Ambulatory Visit (INDEPENDENT_AMBULATORY_CARE_PROVIDER_SITE_OTHER): Payer: BLUE CROSS/BLUE SHIELD | Admitting: Psychology

## 2016-12-29 DIAGNOSIS — F319 Bipolar disorder, unspecified: Secondary | ICD-10-CM | POA: Diagnosis not present

## 2016-12-29 NOTE — Assessment & Plan Note (Addendum)
Improved on 600 mg of Lithium and 20 mg of Latuda.  We did not tell her to stop Cymbalta but eventually wanted it off her list.  She has done that.  Nortriptyline was also recently stopped.  The withdrawal of these two medicines may explain her increase in anxiety and bad dreams.  Will monitor this and hope it goes away.    Depressive symptoms remain an issue.  Dr. Tammi Klippel thought adding Lamictal might help attenuate.  Adverse effects were discussed including the risk of a serious rash and also headache.  Patient voiced an understanding.  Will start at 25 mg for two weeks and then increase to 50 mg.  Will see back in two weeks.  Family relationships are a significant source of distress that is not well managed.  Cutting is the main coping mechanism when she is overwhelmed.  Learning distress tolerance and other DBT skills is a necessary part of getting healthier.  Family therapy will likely have to happen as well.  See patient instructions for further plan.

## 2016-12-29 NOTE — Progress Notes (Signed)
Reason for follow-up:  Continued management of mood.  Issues discussed:  Patient presented with Rodena Piety again.    Lithium:  Unable to tolerate 3 capsules a day.  Was not splitting the dose.  Has been on 600 mg daily consistently.  Feeling more anxious but sleeping better.  Less depressed.  One incident of cutting on leg after a conversation with her mom.  Reports her mom calls in upwards of 15 times a day.  Relationship tends to either be really good or really bad.  Patient and wife note that there seems to be a pattern where stress builds and mom unloads on [place].  [place] has difficulty tolerating her distress and cutting is a method of control.    Stopped Cymbalta secondary to too many side effects.

## 2016-12-29 NOTE — Patient Instructions (Signed)
Please schedule a follow-up for:  October 17th at 10:30.    We (again) talked about a lot today.  Here is the list:  1.  Split the dose of Lithium - one in the morning and two in the evening.  Always with food.  You will likely need 900 mg or more. 2.  Keep the Latuda dose the same. 3.  Lamictal will help put a sturdier floor under your depressive symptoms.  Start low and go slow to avoid a rare but serious condition.  If you develop an unexplained rash, especially with accompanying flu symptoms, we would need to know about that.  25 mg daily for two weeks.  And then 50 mg for two weeks.  3.  Get a therapist that does DBT and go!!  So important to your health and well-being.  You can also check out workbooks at the book store:    This might be helpful.

## 2017-01-12 ENCOUNTER — Ambulatory Visit: Payer: BLUE CROSS/BLUE SHIELD | Admitting: Psychology

## 2017-01-20 IMAGING — CR DG CHEST 2V
2 series · 2 of 2 positions shown · non-contrast
Comparison: PA and lateral chest 01/13/2016 and 11/29/2014.

CLINICAL DATA: Shortness of breath and upper abdominal pain.

EXAM:
CHEST  2 VIEW

[w chest pa]
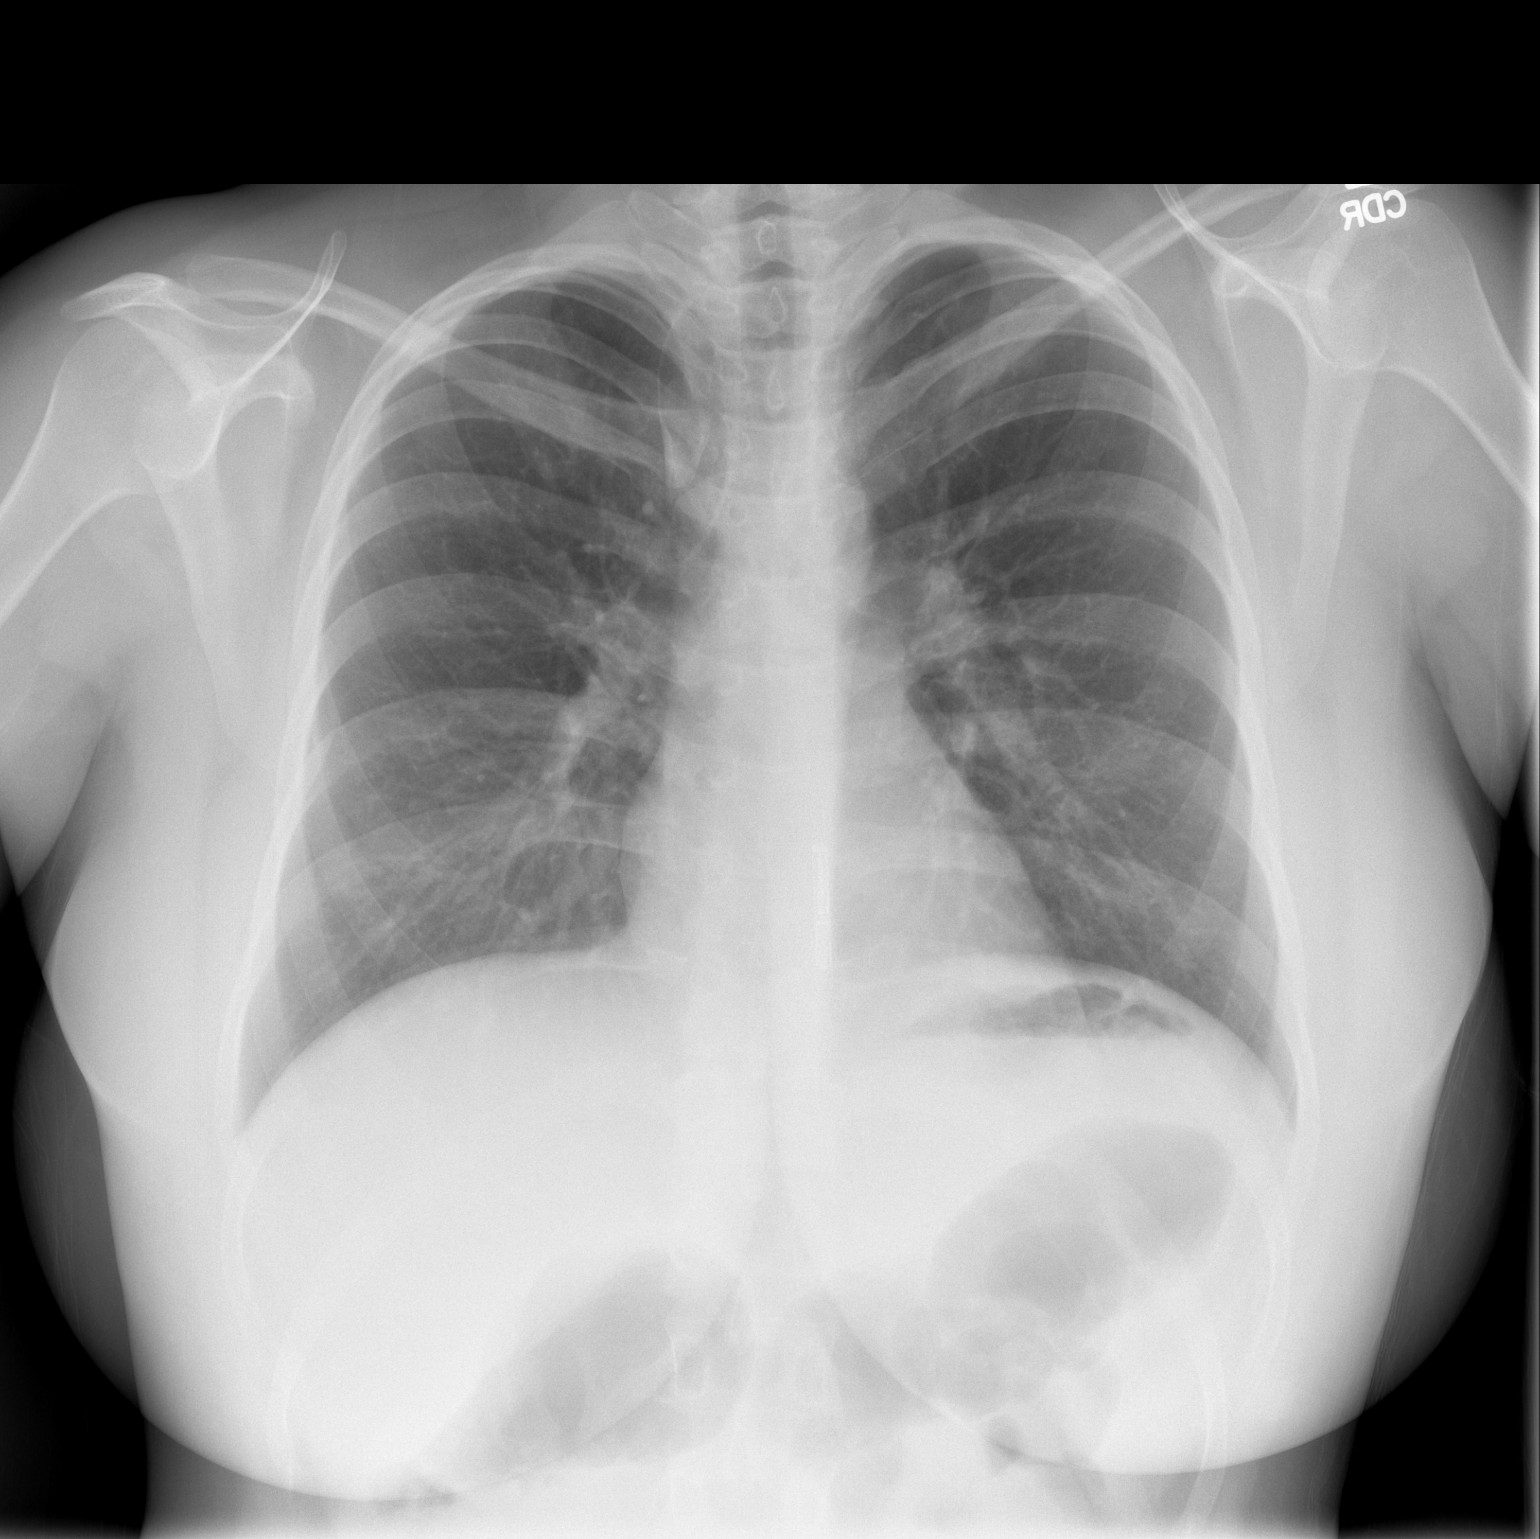

[w chest lat]
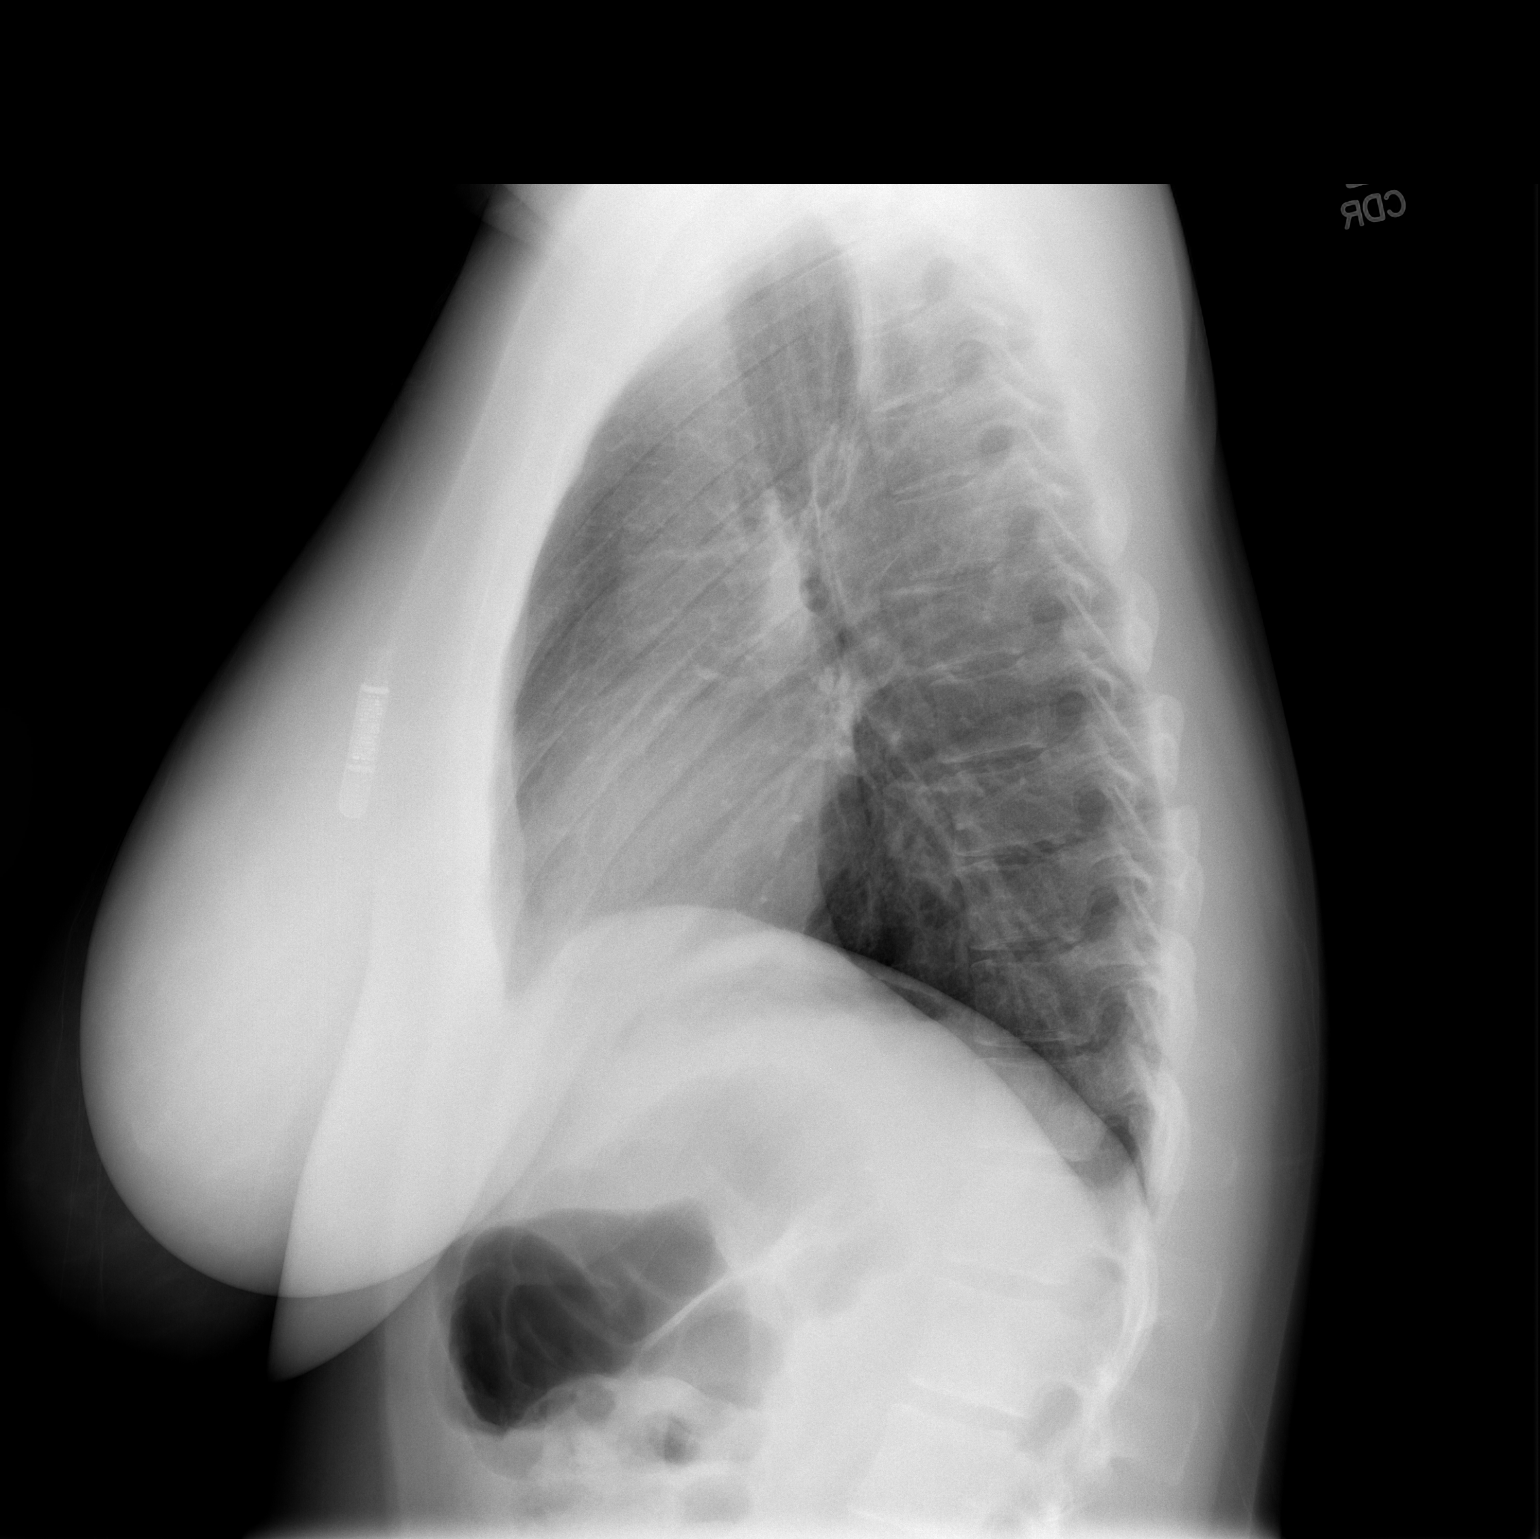

[2 of 2 positions shown; findings below may reference images not displayed]

FINDINGS: Lungs are clear. Heart size is normal. No pneumothorax or pleural
effusion. Loop recorder is noted.
IMPRESSION: No acute disease.

## 2017-01-21 ENCOUNTER — Telehealth: Payer: Self-pay | Admitting: Psychology

## 2017-01-21 NOTE — Telephone Encounter (Signed)
Rodena Piety called to say that Margeaux had "back slid" a bit but was getting back on track.  She missed her last [facility] appointment and was wanting to reschedule.  She was at work at the time.  Scheduled next available which is 02/16/17 at 9:30.    When they come in, we may want to discuss taking responsibility for her healthcare and how Rodena Piety can support that for her but not do it for her.  I get the sense that others wanting her to get well more than she does is counter-therapeutic for her.  Will discuss with the team prior to meeting with her.

## 2017-02-02 ENCOUNTER — Ambulatory Visit: Payer: BLUE CROSS/BLUE SHIELD | Admitting: Family Medicine

## 2017-02-16 ENCOUNTER — Ambulatory Visit (INDEPENDENT_AMBULATORY_CARE_PROVIDER_SITE_OTHER): Payer: BLUE CROSS/BLUE SHIELD | Admitting: Psychology

## 2017-02-16 DIAGNOSIS — F319 Bipolar disorder, unspecified: Secondary | ICD-10-CM

## 2017-02-16 NOTE — Assessment & Plan Note (Addendum)
Patient presented alone which I think is a good sign.  Affect was within normal limits.  She is interested in restarting medicines:  Lithium, lamictal, and latuda.  Dr. Tammi Klippel wrote scripts for all three with 5 refills each.  Would like to get a lithium level but she has either missed appointments or not been taking the medicine every time we have tried.  Will attempt next visit.  With IUD out, discussed risks of these medicines on a pregnancy.  She has had relationships with men in the recent past.  She voiced an understanding.    Of note, she agreed that if she, not Rodena Piety, will be the one to communicate with me about upcoming appointments.  This is intended to tug a little at the family dynamic and also hopefully, create a bit more ownership for her in her own healthcare.    See patient instructions for further plan

## 2017-02-16 NOTE — Patient Instructions (Addendum)
Please schedule a follow-up for December 5th at:  11:00.  We would like to get a Lithium level in two weeks.    Lamictal 100 mg:  1/2 tablet for a week, then one tablet for a week and then two tablets.    Consider therapy as an essential part of your treatment.  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

## 2017-02-16 NOTE — Progress Notes (Signed)
Reason for follow-up:  Management of mood.    Issues discussed:   Restarting medicines:  She "went off track" for about a week.  Had been feeling better and questioned need for the medicine.  Also concerned about weight gain.    Family dynamics:  Wife tends to overfunction for her.  Dynamic of "sick" and "caretaker" is pretty firmly intrenched and difficult for either party to manage a shift.    Symptoms:  Trouble concentrating.  Wonder whether it is the "fibro fog."  Had IUD removed and thinks that has helped with overall symptoms.  Feeling restless and fidgety.  Cut last night on the heels of a fight that she picked because she was feeling off.    Denied risky behavior including using alcohol or drugs, relationships, reckless spending.    Endorsed a 1 on #9 of PHQ-9.  Reports that is related to her cutting.  Denied suicidal ideation.

## 2017-03-02 ENCOUNTER — Ambulatory Visit: Payer: BLUE CROSS/BLUE SHIELD | Admitting: Psychology

## 2017-04-04 IMAGING — US US ABDOMEN LIMITED
1 series · 14 of 25 positions shown · non-contrast
Comparison: CT abdomen pelvis of 02/11/2016

CLINICAL DATA: Intermittent right upper quadrant abdominal pain

EXAM:
US ABDOMEN LIMITED - RIGHT UPPER QUADRANT

[Series 1: us abdomen limited · 0.21mm/px · 14 of 46 slices shown]
[im 1/46]
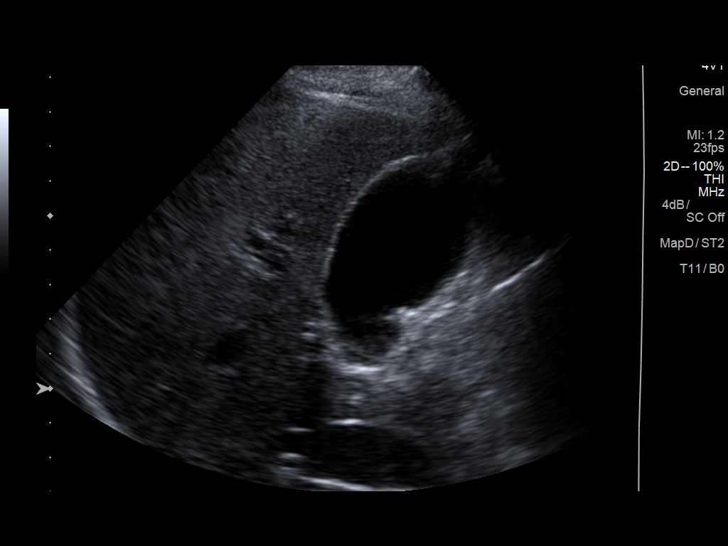
[im 4/46]
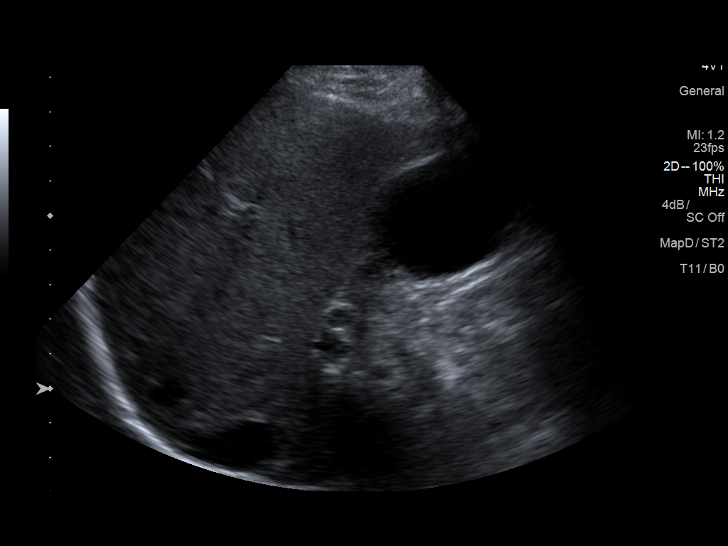
[im 8/46]
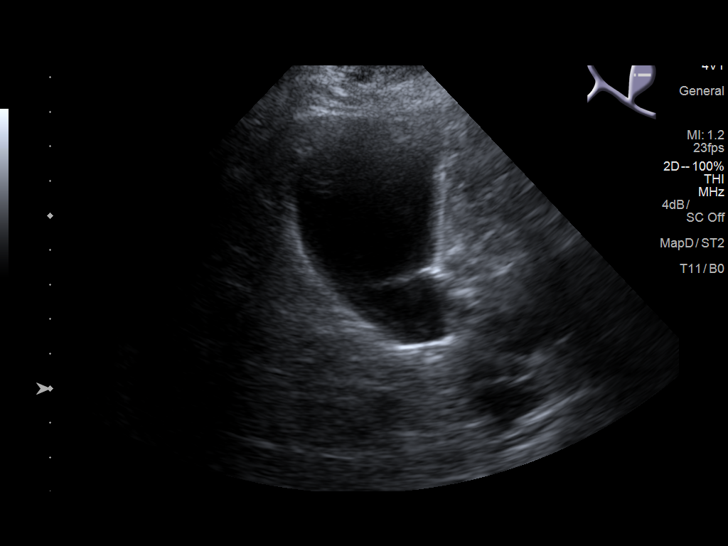
[im 12/46]
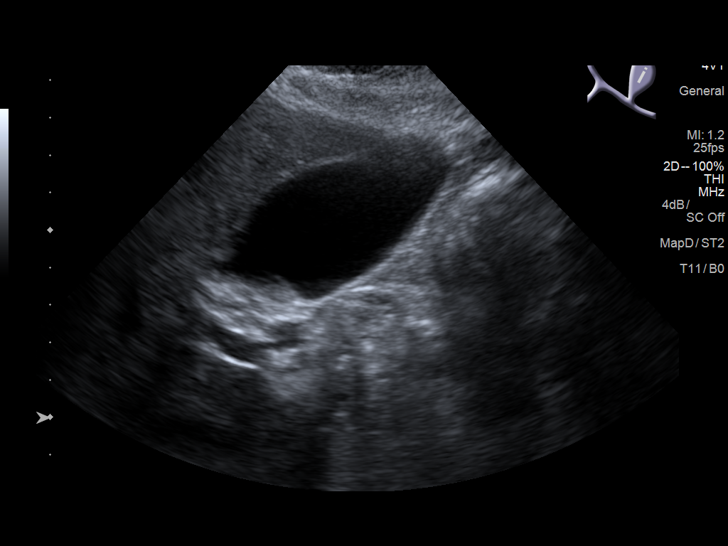
[im 16/46]
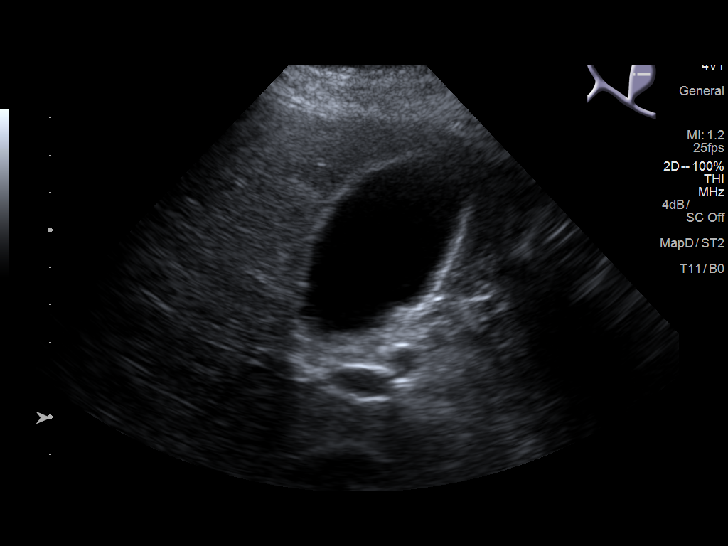
[im 17/46]
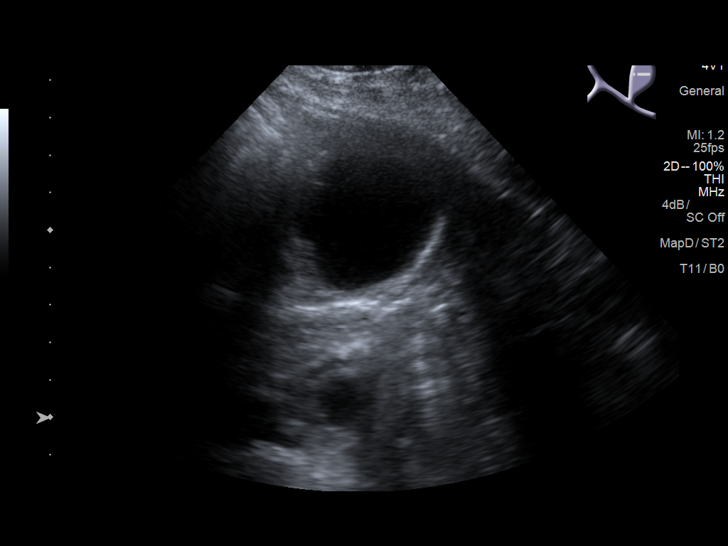
[im 21/46]
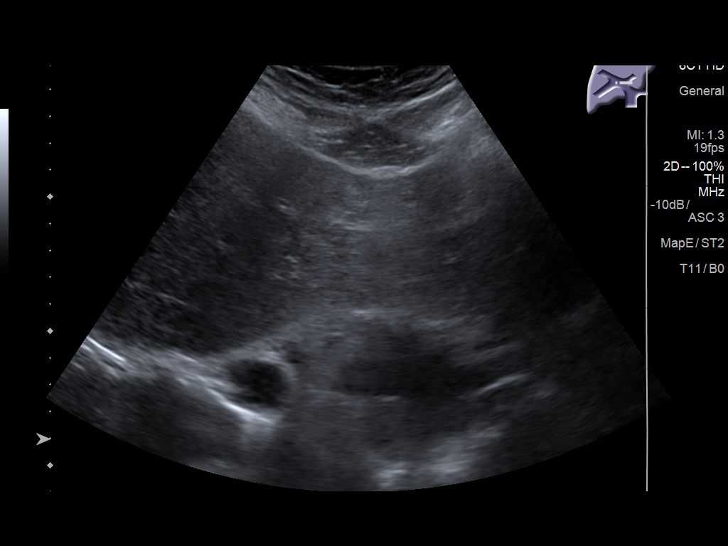
[im 25/46]
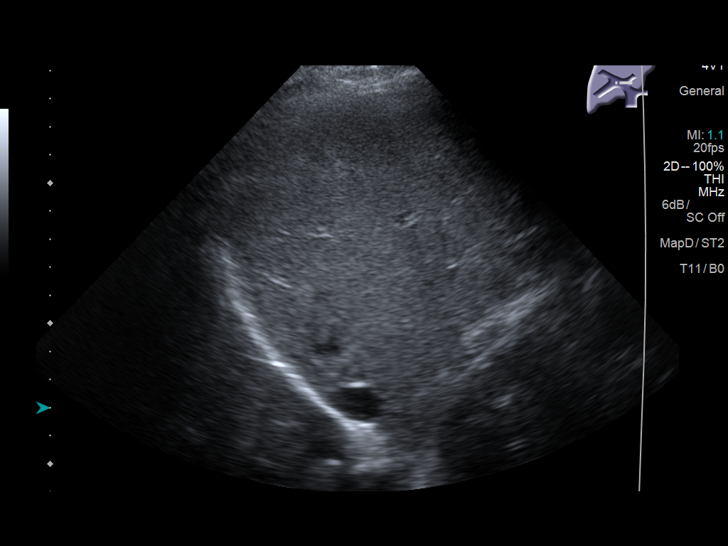
[im 29/46]
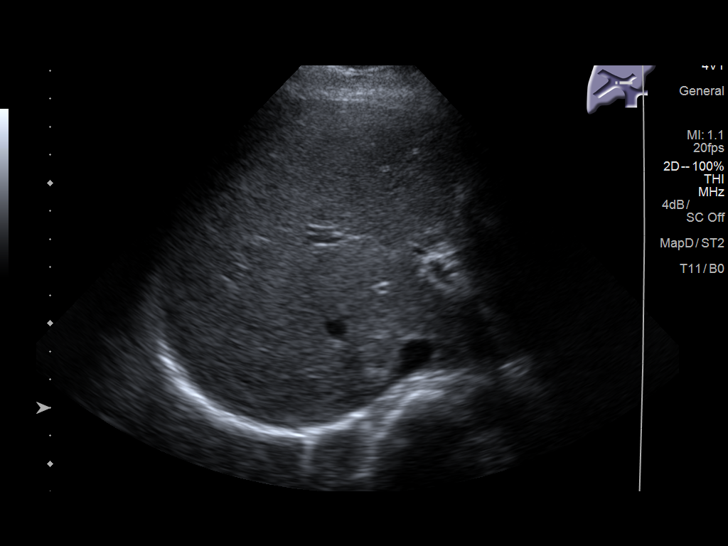
[im 31/46]
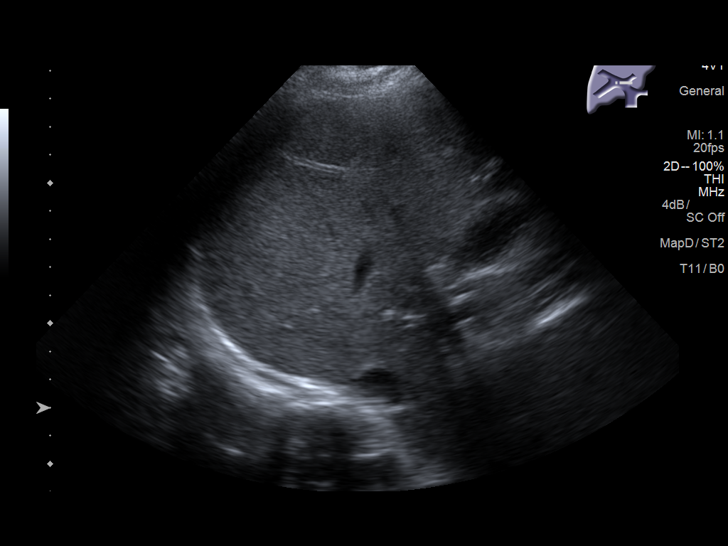
[im 34/46]
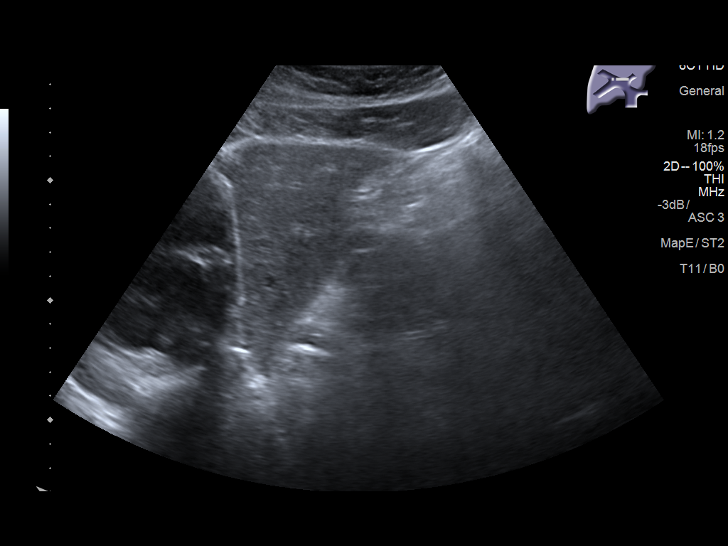
[im 38/46]
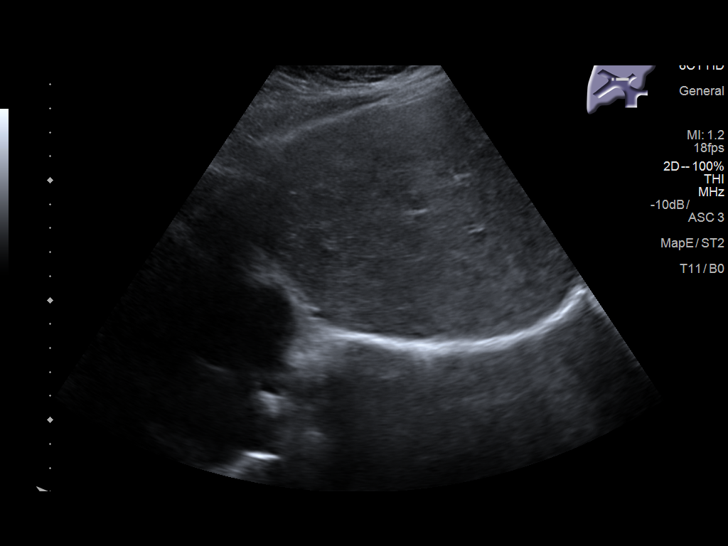
[im 42/46]
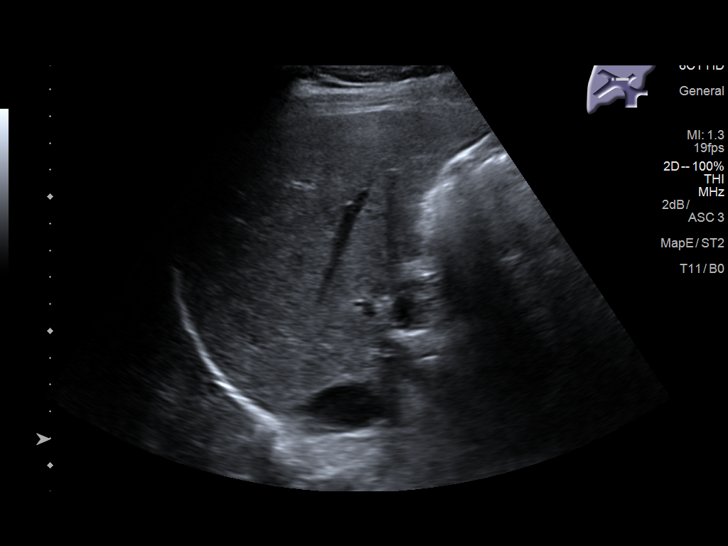
[im 46/46]
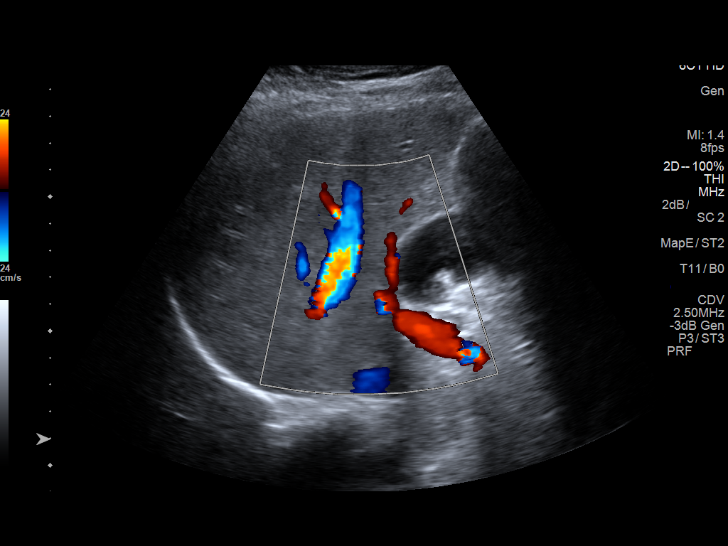

[14 of 25 positions shown; findings below may reference images not displayed]

FINDINGS: Gallbladder:

The gallbladder is visualized and no gallstones are noted.

Common bile duct:

Diameter: The common bile duct is normal measuring 3.6 mm in
diameter.

Liver:

The echogenicity of the liver is somewhat inhomogeneous and slightly
increased suggesting mild fatty infiltration. Correlation with LFTs
is recommended. No focal hepatic abnormality is seen.
IMPRESSION: 1. No gallstones.
2. Question mild fatty infiltration of the liver. Correlate with
LFTs.

## 2017-08-27 ENCOUNTER — Encounter (HOSPITAL_BASED_OUTPATIENT_CLINIC_OR_DEPARTMENT_OTHER): Payer: Self-pay | Admitting: Emergency Medicine

## 2017-08-27 ENCOUNTER — Other Ambulatory Visit: Payer: Self-pay

## 2017-08-27 ENCOUNTER — Emergency Department (HOSPITAL_BASED_OUTPATIENT_CLINIC_OR_DEPARTMENT_OTHER)
Admission: EM | Admit: 2017-08-27 | Discharge: 2017-08-27 | Disposition: A | Discharge status: Home / Self Care | Payer: BLUE CROSS/BLUE SHIELD | Attending: Physician Assistant | Admitting: Physician Assistant

## 2017-08-27 ENCOUNTER — Emergency Department (HOSPITAL_BASED_OUTPATIENT_CLINIC_OR_DEPARTMENT_OTHER): Payer: BLUE CROSS/BLUE SHIELD

## 2017-08-27 DIAGNOSIS — R1011 Right upper quadrant pain: Secondary | ICD-10-CM

## 2017-08-27 DIAGNOSIS — Z79899 Other long term (current) drug therapy: Secondary | ICD-10-CM | POA: Diagnosis not present

## 2017-08-27 DIAGNOSIS — K2970 Gastritis, unspecified, without bleeding: Secondary | ICD-10-CM | POA: Diagnosis not present

## 2017-08-27 DIAGNOSIS — J45909 Unspecified asthma, uncomplicated: Secondary | ICD-10-CM | POA: Insufficient documentation

## 2017-08-27 LAB — COMPREHENSIVE METABOLIC PANEL
ALT: 11 U/L — ABNORMAL LOW (ref 14–54)
AST: 18 U/L (ref 15–41)
Albumin: 4 g/dL (ref 3.5–5.0)
Alkaline Phosphatase: 106 U/L (ref 38–126)
Anion gap: 9 (ref 5–15)
BUN: 9 mg/dL (ref 6–20)
CO2: 23 mmol/L (ref 22–32)
Calcium: 9.2 mg/dL (ref 8.9–10.3)
Chloride: 106 mmol/L (ref 101–111)
Creatinine, Ser: 0.7 mg/dL (ref 0.44–1.00)
GFR calc Af Amer: >60 mL/min (ref >60)
GFR calc non Af Amer: >60 mL/min (ref >60)
Glucose, Bld: 98 mg/dL (ref 65–99)
Potassium: 3.5 mmol/L (ref 3.5–5.1)
Sodium: 138 mmol/L (ref 135–145)
Total Bilirubin: 0.5 mg/dL (ref 0.3–1.2)
Total Protein: 7.6 g/dL (ref 6.5–8.1)

## 2017-08-27 LAB — URINALYSIS, ROUTINE W REFLEX MICROSCOPIC
Bilirubin Urine: NEGATIVE
Glucose, UA: NEGATIVE mg/dL
Hgb urine dipstick: NEGATIVE
Ketones, ur: 15 mg/dL — AB
Leukocytes, UA: NEGATIVE
Nitrite: NEGATIVE
Protein, ur: NEGATIVE mg/dL
Specific Gravity, Urine: 1.015 (ref 1.005–1.030)
pH: 7.5 (ref 5.0–8.0)

## 2017-08-27 LAB — CBC
HCT: 40.8 % (ref 36.0–46.0)
Hemoglobin: 13.8 g/dL (ref 12.0–15.0)
MCH: 28.9 pg (ref 26.0–34.0)
MCHC: 33.8 g/dL (ref 30.0–36.0)
MCV: 85.5 fL (ref 78.0–100.0)
Platelets: 400 10*3/uL (ref 150–400)
RBC: 4.77 MIL/uL (ref 3.87–5.11)
RDW: 13.1 % (ref 11.5–15.5)
WBC: 9 10*3/uL (ref 4.0–10.5)

## 2017-08-27 LAB — PREGNANCY, URINE: Preg Test, Ur: NEGATIVE

## 2017-08-27 LAB — LIPASE, BLOOD: Lipase: 31 U/L (ref 11–51)

## 2017-08-27 MED ORDER — OMEPRAZOLE 20 MG PO CPDR: 20.0000 mg | DELAYED_RELEASE_CAPSULE | Freq: Every day | ORAL | 0 refills | Status: DC

## 2017-08-27 MED ORDER — PROMETHAZINE HCL 25 MG/ML IJ SOLN
25.0000 mg | Freq: Once | INTRAMUSCULAR | Status: AC
  Administered 2017-08-27: 25 mg via INTRAVENOUS
  Filled 2017-08-27: qty 1

## 2017-08-27 MED ORDER — MORPHINE SULFATE (PF) 2 MG/ML IV SOLN
2.0000 mg | Freq: Once | INTRAVENOUS | Status: AC
  Administered 2017-08-27: 2 mg via INTRAVENOUS
  Filled 2017-08-27: qty 1

## 2017-08-27 MED ORDER — SODIUM CHLORIDE 0.9 % IV BOLUS
1000.0000 mL | Freq: Once | INTRAVENOUS | Status: AC
  Administered 2017-08-27: 1000 mL via INTRAVENOUS

## 2017-08-27 MED ORDER — MORPHINE SULFATE (PF) 4 MG/ML IV SOLN: 4.0000 mg | Freq: Once | INTRAVENOUS | Status: DC

## 2017-08-27 MED ORDER — PROMETHAZINE HCL 25 MG PO TABS: 25.0000 mg | ORAL_TABLET | Freq: Four times a day (QID) | ORAL | 0 refills | Status: DC | PRN

## 2017-08-27 MED ORDER — FAMOTIDINE IN NACL 20-0.9 MG/50ML-% IV SOLN
20.0000 mg | Freq: Once | INTRAVENOUS | Status: AC
  Administered 2017-08-27: 20 mg via INTRAVENOUS
  Filled 2017-08-27: qty 50

## 2017-08-27 NOTE — ED Notes (Signed)
Patient transported back to room from Ultrasound

## 2017-08-27 NOTE — ED Triage Notes (Signed)
RUQ abd pain with vomiting x 1 week. Hx of gall bladder issues.

## 2017-08-27 NOTE — Discharge Instructions (Addendum)
Follow-up with the GI specialist listed below for further evaluation.

## 2017-08-27 NOTE — ED Provider Notes (Signed)
[facility] EMERGENCY DEPARTMENT Provider Note   CSN: [id number] Arrival date & time: 08/27/17  0272     History   Chief Complaint Chief Complaint  Patient presents with  . Abdominal Pain    HPI Amber Tyler is a 22 y.o. female with a past medical history of fibromyalgia, POTS, who presents to ED for evaluation of 1 week history of right upper quadrant/epigastric abdominal pain.  She states that this feels similar to the prior episode she has had of her "gallbladder flareup" and/or gastritis.  She has tried several doses of over-the-counter antacids with no improvement in her symptoms.  She reports nonbloody, nonbilious emesis anytime she tries to eat or drink anything.  She had a similar episode 3 months ago and was told it was gastritis.  She reports several episodes of diarrhea as well.  Denies any dysuria, hematuria, fever, sick contacts with similar symptoms, suspicious food ingestions, vaginal symptoms.  No prior abdominal surgeries reported.  Denies any alcohol, tobacco or other drug use.  Denies any chronic NSAID use.  HPI  Past Medical History:  Diagnosis Date  . Asthma   . Fibromyalgia   . POTS (postural orthostatic tachycardia syndrome)     Patient Active Problem List   Diagnosis Date Noted  . Otalgia of right ear 10/19/2016  . IUD contraception 08/31/2016  . Fibromyalgia 04/25/2016  . Rash and nonspecific skin eruption 04/25/2016  . Menorrhagia 04/25/2016  . RUQ pain   . Leukocytosis   . Awareness of heartbeats 12/01/2014  . Thrombocytosis ([place]) 11/19/2013  . Other malaise and fatigue 11/15/2013  . Frequent headaches 04/05/2013  . Dysmenorrhea 05/05/2012  . Concussion 02/29/2012  . Bipolar I disorder, current episode depressed ([place]) 11/17/2011  . Overdose of antidepressant 11/16/2011  . Asthma 07/21/2010    Past Surgical History:  Procedure Laterality Date  . ADENOIDECTOMY    . ANKLE SURGERY Right   . internal heart monitor    . KNEE SURGERY     . TONSILLECTOMY       OB History   None      Home Medications    Prior to Admission medications   Medication Sig Start Date End Date Taking? Authorizing Provider  busPIRone (BUSPAR) 7.5 MG tablet Take 1 tablet (7.5 mg total) by mouth 2 (two) times daily. 03/24/16   Dickie La, MD  DULoxetine (CYMBALTA) 60 MG capsule Take 60 mg by mouth daily.    [provider]  fluticasone (FLONASE) 50 MCG/ACT nasal spray Place 2 sprays into both nostrils daily. 10/19/16   Mayo, Pete Pelt, MD  HYDROcodone-acetaminophen (NORCO/VICODIN) 5-325 MG tablet Take 1-2 tablets by mouth every 6 (six) hours as needed. 12/19/16   Veryl Speak, MD  lamoTRIgine (LAMICTAL) 25 MG tablet Take 50 mg by mouth daily. Take 25 mg for two weeks and then 50 mg.  Per Dr. Tammi Klippel in [facility].  #60 with one refill 12/29/16 02/02/17  Vickii Penna, MD  lithium carbonate 300 MG capsule Take 3 capsules (900 mg total) by mouth daily with supper. 12/01/16   Vickii Penna, MD  lurasidone (LATUDA) 20 MG TABS tablet Take 1 tablet (20 mg total) by mouth daily with supper. 12/01/16   Vickii Penna, MD  norgestimate-ethinyl estradiol (ORTHO-CYCLEN,SPRINTEC,PREVIFEM) 0.25-35 MG-MCG tablet Take 1 tablet by mouth daily. 05/13/16   Dickie La, MD  nortriptyline (PAMELOR) 10 MG capsule Take 1 capsule (10 mg total) by mouth at bedtime. 10/19/16   Mayo, Pete Pelt, MD  omeprazole (PRILOSEC) 20 MG capsule Take 1 capsule (20 mg total) by mouth daily. 08/27/17   Zharia Conrow, PA-C  promethazine (PHENERGAN) 25 MG tablet Take 1 tablet (25 mg total) by mouth every 6 (six) hours as needed for nausea or vomiting. 08/27/17   Delia Heady, PA-C    Family History Family History  Problem Relation Age of Onset  . Diabetes Neg Hx     Social History Social History   Tobacco Use  . Smoking status: Never Smoker  . Smokeless tobacco: Never Used  Substance Use Topics  . Alcohol use: Yes    Comment: occ  . Drug use: No     Allergies     Ondansetron hcl; Sulfonamide derivatives; and Tape   Review of Systems Review of Systems  Constitutional: Negative for appetite change, chills and fever.  HENT: Negative for ear pain, rhinorrhea, sneezing and sore throat.   Eyes: Negative for photophobia and visual disturbance.  Respiratory: Negative for cough, chest tightness, shortness of breath and wheezing.   Cardiovascular: Negative for chest pain and palpitations.  Gastrointestinal: Positive for abdominal pain, diarrhea, nausea and vomiting. Negative for blood in stool and constipation.  Genitourinary: Negative for dysuria, hematuria and urgency.  Musculoskeletal: Negative for myalgias.  Skin: Negative for rash.  Neurological: Negative for dizziness, weakness and light-headedness.     Physical Exam Updated Vital Signs BP 128/84 (BP Location: Left Arm)   Pulse 98   Temp 97.9 F (36.6 C) (Oral)   Resp 16   Ht 5\' 4"  (1.626 m)   Wt 81.6 kg (180 lb)   LMP 08/25/2017   SpO2 100%   BMI 30.90 kg/m   Physical Exam  Constitutional: She appears well-developed and well-nourished. No distress.  Nontoxic-appearing and in no acute distress.  Does not appear dehydrated.  HENT:  Head: Normocephalic and atraumatic.  Nose: Nose normal.  Eyes: Conjunctivae and EOM are normal. Left eye exhibits no discharge. No scleral icterus.  Neck: Normal range of motion. Neck supple.  Cardiovascular: Normal rate, regular rhythm, normal heart sounds and intact distal pulses. Exam reveals no gallop and no friction rub.  No murmur heard. Pulmonary/Chest: Effort normal and breath sounds normal. No respiratory distress.  Abdominal: Soft. Bowel sounds are normal. She exhibits no distension. There is tenderness in the right upper quadrant. There is CVA tenderness (Right). There is no rebound and no guarding.  Musculoskeletal: Normal range of motion. She exhibits no edema.  Neurological: She is alert. She exhibits normal muscle tone. Coordination normal.   Skin: Skin is warm and dry. No rash noted.  Psychiatric: She has a normal mood and affect.  Nursing note and vitals reviewed.    ED Treatments / Results  Labs (all labs ordered are listed, but only abnormal results are displayed) Labs Reviewed  COMPREHENSIVE METABOLIC PANEL - Abnormal; Notable for the following components:      Result Value   ALT 11 (*)    All other components within normal limits  URINALYSIS, ROUTINE W REFLEX MICROSCOPIC - Abnormal; Notable for the following components:   Ketones, ur 15 (*)    All other components within normal limits  LIPASE, BLOOD  CBC  PREGNANCY, URINE    EKG None  Radiology US Abdomen Limited Ruq  Result Date: 08/27/2017 CLINICAL DATA:  Right upper quadrant pain EXAM: ULTRASOUND ABDOMEN LIMITED RIGHT UPPER QUADRANT COMPARISON:  None. FINDINGS: Gallbladder: No gallstones or wall thickening visualized. No sonographic Murphy sign noted by sonographer. Common bile duct: Diameter: 2  mm Liver: Probable hepatic steatosis. No focal mass. Portal vein is patent on color Doppler imaging with normal direction of blood flow towards the liver. IMPRESSION: No cause for right upper quadrant pain identified. Electronically Signed   By: Dorise Bullion III M.D   On: 08/27/2017 11:23    Procedures Procedures (including critical care time)  Medications Ordered in ED Medications  sodium chloride 0.9 % bolus 1,000 mL (1,000 mLs Intravenous New Bag/Given 08/27/17 1051)  promethazine (PHENERGAN) injection 25 mg (25 mg Intravenous Given 08/27/17 1051)  famotidine (PEPCID) IVPB 20 mg premix (20 mg Intravenous New Bag/Given 08/27/17 1051)  morphine 2 MG/ML injection 2 mg (2 mg Intravenous Given 08/27/17 1051)     Initial Impression / Assessment and Plan / ED Course  I have reviewed the triage vital signs and the nursing notes.  Pertinent labs & imaging results that were available during my care of the patient were reviewed by me and considered in my medical decision  making (see chart for details).     Patient presents to ED for evaluation of RUQ/epigastric abdominal pain, several episodes of nonbloody, nonbilious emesis.  She also reports several episodes of diarrhea.  She has been told in the past that she had a "inflamed gallbladder" but has not had surgery for it.  Denies any fever, urinary symptoms.  On physical exam she is overall well-appearing.  She does not appear dehydrated.  Vital signs are within normal limits.  She has right upper quadrant and epigastric abdominal pain tenderness to palpation with no rebound or guarding noted.  Right-sided CVA tenderness noted.  Lab work including CBC, CMP, lipase, urinalysis and urine pregnancy unremarkable.  No leukocytosis, elevated LFTs or signs of UTI.  Right upper quadrant ultrasound was unremarkable.  Suspect that her symptoms are due to gastritis. Doubt cholecystitis, appendicitis, or other surgical or emergent cause of symptoms. Patient able to tolerate p.o. intake without difficulty prior to discharge.  Will advise daily antacid and follow-up with GI if symptoms persist.  Advised to return to ED for any severe worsening symptoms.  Portions of this note were generated with [employer]. Dictation errors may occur despite best attempts at proofreading.   Final Clinical Impressions(s) / ED Diagnoses   Final diagnoses:  RUQ pain  Gastritis without bleeding, unspecified chronicity, unspecified gastritis type    ED Discharge Orders        Ordered    omeprazole (PRILOSEC) 20 MG capsule  Daily     08/27/17 1146    promethazine (PHENERGAN) 25 MG tablet  Every 6 hours PRN     08/27/17 1146       Delia Heady, PA-C 08/27/17 1150    [place], Fredia Sorrow, MD 08/28/17 [phone]

## 2020-05-04 ENCOUNTER — Encounter (HOSPITAL_BASED_OUTPATIENT_CLINIC_OR_DEPARTMENT_OTHER): Payer: Self-pay | Admitting: Emergency Medicine

## 2020-05-04 ENCOUNTER — Other Ambulatory Visit: Payer: Self-pay

## 2020-05-04 ENCOUNTER — Emergency Department (HOSPITAL_BASED_OUTPATIENT_CLINIC_OR_DEPARTMENT_OTHER)
Admission: EM | Admit: 2020-05-04 | Discharge: 2020-05-04 | Disposition: A | Discharge status: Home / Self Care | Payer: Self-pay | Attending: Emergency Medicine | Admitting: Emergency Medicine

## 2020-05-04 ENCOUNTER — Emergency Department (HOSPITAL_BASED_OUTPATIENT_CLINIC_OR_DEPARTMENT_OTHER): Payer: Self-pay

## 2020-05-04 DIAGNOSIS — J45909 Unspecified asthma, uncomplicated: Secondary | ICD-10-CM | POA: Insufficient documentation

## 2020-05-04 DIAGNOSIS — W2209XA Striking against other stationary object, initial encounter: Secondary | ICD-10-CM | POA: Insufficient documentation

## 2020-05-04 DIAGNOSIS — S81011A Laceration without foreign body, right knee, initial encounter: Secondary | ICD-10-CM | POA: Insufficient documentation

## 2020-05-04 DIAGNOSIS — Z7951 Long term (current) use of inhaled steroids: Secondary | ICD-10-CM | POA: Insufficient documentation

## 2020-05-04 DIAGNOSIS — S8991XA Unspecified injury of right lower leg, initial encounter: Secondary | ICD-10-CM

## 2020-05-04 DIAGNOSIS — Y9301 Activity, walking, marching and hiking: Secondary | ICD-10-CM | POA: Insufficient documentation

## 2020-05-04 NOTE — ED Provider Notes (Signed)
[place] EMERGENCY DEPARTMENT Provider Note   CSN: [id number] Arrival date & time: 05/04/20  1047     History Chief Complaint  Patient presents with  . Knee Injury    Amber Tyler is a 25 y.o. female history of fibromyalgia, asthma, POTS.  Patient arrives today for right knee injury that occurred around 7 PM yesterday she was walking up the stairs when she slipped hitting her right patella on the corner, she reports immediate onset sharp aching pain moderate intensity nonradiating worsened with movement palpation improved with rest.  She reports that she got up and continued walking up the steps, has been ambulatory without assistance since that time.  Reports this morning she noticed a cut to her right kneecap, she applied NuSkin to the area after cleaning it  Denies head injury, loss consciousness, blood thinner use, neck pain, headache, nausea/vomiting, chest pain, back pain, pain to the other extremities or joints or any additional concerns  Of note patient reports her Tdap is up-to-date within the last 5 years HPI     Past Medical History:  Diagnosis Date  . Asthma   . Fibromyalgia   . POTS (postural orthostatic tachycardia syndrome)     Patient Active Problem List   Diagnosis Date Noted  . Otalgia of right ear 10/19/2016  . IUD contraception 08/31/2016  . Fibromyalgia 04/25/2016  . Rash and nonspecific skin eruption 04/25/2016  . Menorrhagia 04/25/2016  . RUQ pain   . Leukocytosis   . Awareness of heartbeats 12/01/2014  . Thrombocytosis 11/19/2013  . Other malaise and fatigue 11/15/2013  . Frequent headaches 04/05/2013  . Dysmenorrhea 05/05/2012  . Concussion 02/29/2012  . Bipolar I disorder, current episode depressed ([place]) 11/17/2011  . Overdose of antidepressant 11/16/2011  . Asthma 07/21/2010    Past Surgical History:  Procedure Laterality Date  . ADENOIDECTOMY    . ANKLE SURGERY Right   . BREAST SURGERY    . internal heart monitor    .  KNEE SURGERY    . TONSILLECTOMY       OB History   No obstetric history on file.     Family History  Problem Relation Age of Onset  . Diabetes Neg Hx     Social History   Tobacco Use  . Smoking status: Never Smoker  . Smokeless tobacco: Never Used  Substance Use Topics  . Alcohol use: Yes    Comment: occ  . Drug use: No    Home Medications Prior to Admission medications   Medication Sig Start Date End Date Taking? Authorizing Provider  busPIRone (BUSPAR) 7.5 MG tablet Take 1 tablet (7.5 mg total) by mouth 2 (two) times daily. 03/24/16   Dickie La, MD  DULoxetine (CYMBALTA) 60 MG capsule Take 60 mg by mouth daily.    [provider]  fluticasone (FLONASE) 50 MCG/ACT nasal spray Place 2 sprays into both nostrils daily. 10/19/16   Mayo, Pete Pelt, MD  HYDROcodone-acetaminophen (NORCO/VICODIN) 5-325 MG tablet Take 1-2 tablets by mouth every 6 (six) hours as needed. 12/19/16   Veryl Speak, MD  lamoTRIgine (LAMICTAL) 25 MG tablet Take 50 mg by mouth daily. Take 25 mg for two weeks and then 50 mg.  Per Dr. Tammi Klippel in [facility].  #60 with one refill 12/29/16 02/02/17  Vickii Penna, MD  lithium carbonate 300 MG capsule Take 3 capsules (900 mg total) by mouth daily with supper. 12/01/16   Vickii Penna, MD  lurasidone (LATUDA) 20 MG TABS tablet  Take 1 tablet (20 mg total) by mouth daily with supper. 12/01/16   Vickii Penna, MD  norgestimate-ethinyl estradiol (ORTHO-CYCLEN,SPRINTEC,PREVIFEM) 0.25-35 MG-MCG tablet Take 1 tablet by mouth daily. 05/13/16   Dickie La, MD  nortriptyline (PAMELOR) 10 MG capsule Take 1 capsule (10 mg total) by mouth at bedtime. 10/19/16   Mayo, Pete Pelt, MD  omeprazole (PRILOSEC) 20 MG capsule Take 1 capsule (20 mg total) by mouth daily. 08/27/17   Khatri, Hina, PA-C  promethazine (PHENERGAN) 25 MG tablet Take 1 tablet (25 mg total) by mouth every 6 (six) hours as needed for nausea or vomiting. 08/27/17   Khatri, Hina, PA-C    Allergies     Ondansetron hcl, Sulfonamide derivatives, and Tape  Review of Systems   Review of Systems  Constitutional: Negative.  Negative for chills and fever.  Cardiovascular: Negative.  Negative for chest pain.  Gastrointestinal: Negative.  Negative for nausea and vomiting.  Musculoskeletal: Positive for arthralgias (Right knee). Negative for back pain and neck pain.  Skin: Positive for wound (Right knee).  Neurological: Negative.  Negative for syncope, weakness, numbness and headaches.    Physical Exam Updated Vital Signs BP 116/78 (BP Location: Right Arm)   Pulse 86   Temp 97.9 F (36.6 C) (Oral)   Resp 18   Ht 5\' 5"  (1.651 m)   Wt 77.1 kg   LMP 04/08/2020   SpO2 100%   BMI 28.29 kg/m   Physical Exam Constitutional:      General: She is not in acute distress.    Appearance: Normal appearance. She is well-developed. She is not ill-appearing or diaphoretic.  HENT:     Head: Normocephalic and atraumatic.  Eyes:     General: Vision grossly intact. Gaze aligned appropriately.     Pupils: Pupils are equal, round, and reactive to light.  Neck:     Trachea: Trachea and phonation normal.  Cardiovascular:     Rate and Rhythm: Normal rate and regular rhythm.     Pulses:          Dorsalis pedis pulses are 2+ on the right side and 2+ on the left side.  Pulmonary:     Effort: Pulmonary effort is normal. No respiratory distress.  Abdominal:     General: There is no distension.     Palpations: Abdomen is soft.     Tenderness: There is no abdominal tenderness. There is no guarding or rebound.  Musculoskeletal:        General: Normal range of motion.     Cervical back: Normal range of motion.     Comments: Small laceration with evidence of healing present to the right knee.  No significant erythema no streaking fluctuance induration or drainage.  No increased warmth.  Flexion and extension intact with limited range of motion due to pain.  Resisted range of motion present with both flexion  and extension.  Straight leg raise intact.  Full range of motion at the hip ankle and foot without pain.  Strong equal pedal pulses.  Capillary refill and sensation intact to toes.  Compartments soft.  No lateral/medial or posterior knee pain.  Skin:    General: Skin is warm and dry.     Findings: Wound present.       Neurological:     Mental Status: She is alert.     GCS: GCS eye subscore is 4. GCS verbal subscore is 5. GCS motor subscore is 6.     Comments: Speech is  clear and goal oriented, follows commands Major Cranial nerves without deficit, no facial droop Moves extremities without ataxia, coordination intact  Psychiatric:        Behavior: Behavior normal.       ED Results / Procedures / Treatments   Labs (all labs ordered are listed, but only abnormal results are displayed) Labs Reviewed - No data to display  EKG None  Radiology DG Knee Complete 4 Views Right  Result Date: 05/04/2020 CLINICAL DATA:  Fall EXAM: RIGHT KNEE - COMPLETE 4+ VIEW COMPARISON:  March 22, 2018. FINDINGS: No acute fracture or dislocation. Joint spaces and alignment are maintained. No area of erosion or osseous destruction. No unexpected radiopaque foreign body. Soft tissues are unremarkable. IMPRESSION: No acute fracture or dislocation. Electronically Signed   By: Valentino Saxon MD   On: 05/04/2020 11:23    Procedures Procedures   Medications Ordered in ED Medications - No data to display  ED Course  I have reviewed the triage vital signs and the nursing notes.  Pertinent labs & imaging results that were available during my care of the patient were reviewed by me and considered in my medical decision making (see chart for details).    MDM Rules/Calculators/A&P                         Additional history obtained from: 1. Nursing notes from this visit. 2. Family member at bedside ------------------ DG Right Knee:  IMPRESSION:  No acute fracture or dislocation.   25 year old  female presented after mechanical fall onto her right knee yesterday she suffered small abrasion/laceration to the right knee which she cleaned and applied NuSkin to yesterday.  She has been ambulatory on the knee without assistance.  X-ray reviewed and shows no evidence of fracture or dislocation.  Suspect patient with likely contusion or other soft tissue injury of the knee.  There is no evidence for cellulitis, septic arthritis, DVT, compartment syndrome, neurovascular compromise, gross ligamentous laxity or other emergent pathologies at this time.  Patient does have extension and flexion intact at the right knee, no evidence for patella tendon rupture, plateau fracture or other emergent pathologies.  Will give patient knee immobilizer, crutches and advised to remain nonweightbearing until follow-up with sports medicine specialist and/or PCP next week.  Discussed rice therapy and OTC anti-inflammatories.  There is no indication for repair of patient's small wound, advised her to keep it clean and watch for signs of infection.  Her Tdap is up-to-date, no indication for antibiotics at this time.   At this time there does not appear to be any evidence of an acute emergency medical condition and the patient appears stable for discharge with appropriate outpatient follow up. Diagnosis was discussed with patient who verbalizes understanding of care plan and is agreeable to discharge. I have discussed return precautions with patient and family who verbalizes understanding. Patient encouraged to follow-up with their PCP. All questions answered.   Note: Portions of this report may have been transcribed using voice recognition software. Every effort was made to ensure accuracy; however, inadvertent computerized transcription errors may still be present. Final Clinical Impression(s) / ED Diagnoses Final diagnoses:  Injury of right knee, initial encounter    Rx / DC Orders ED Discharge Orders    None        Gari Crown 05/04/20 1208    Wyvonnia Dusky, MD 05/04/20 1806

## 2020-05-04 NOTE — ED Triage Notes (Signed)
Fell last night while going up stairs and hit R knee on the corner of the step.

## 2020-05-04 NOTE — Discharge Instructions (Signed)
At this time there does not appear to be the presence of an emergent medical condition, however there is always the potential for conditions to change. Please read and follow the below instructions.  Please return to the Emergency Department immediately for any new or worsening symptoms. Please be sure to follow up with your Primary Care Provider within one week regarding your visit today; please call their office to schedule an appointment even if you are feeling better for a follow-up visit. Your x-ray today did not show any obvious fractures or dislocations however unseen fractures and other soft tissue injury such as sprains and strains and meniscal injuries may be present.  Please use the knee brace and crutches to keep weight off of your right knee until you are reevaluated by either your primary care provider or the sports medicine specialist Dr. Raeford Razor.  Follow-up imaging may be needed if your symptoms do not improve.  Please use rest ice elevation to help with your pain along with over-the-counter anti-inflammatory such as Tylenol as directed on the packaging.  Please keep your small cut underneath clean wash gently with clean soapy water twice daily and keep covered with antibiotic ointment and sterile bandage.  Return to the ER if you develop signs of infection including redness swelling drainage increased pain streaking fever or any additional concerns.  Go to the nearest Emergency Department immediately if: You have fever or chills Your knee swells, and the swelling gets worse. You cannot move your knee. You have very bad knee pain that does not get better with pain medicine. You have any new/concerning or worsening of symptoms   Please read the additional information packets attached to your discharge summary.  Do not take your medicine if  develop an itchy rash, swelling in your mouth or lips, or difficulty breathing; call 911 and seek immediate emergency medical attention if this  occurs.  You may review your lab tests and imaging results in their entirety on your MyChart account.  Please discuss all results of fully with your primary care provider and other specialist at your follow-up visit.  Note: Portions of this text may have been transcribed using voice recognition software. Every effort was made to ensure accuracy; however, inadvertent computerized transcription errors may still be present.

## 2020-05-04 NOTE — ED Notes (Signed)
As I write this; w-rays are being taken.

## 2020-05-14 ENCOUNTER — Telehealth: Payer: Self-pay | Admitting: Family Medicine

## 2020-05-14 NOTE — Telephone Encounter (Signed)
Called pt to scheduled ED follow up appt for knee injury w/ Dr.Schmitz--no answer, message left--glh

## 2022-04-03 ENCOUNTER — Emergency Department (HOSPITAL_BASED_OUTPATIENT_CLINIC_OR_DEPARTMENT_OTHER)
Admission: EM | Admit: 2022-04-03 | Discharge: 2022-04-03 | Disposition: A | Discharge status: Home / Self Care | Payer: Self-pay | Attending: Emergency Medicine | Admitting: Emergency Medicine

## 2022-04-03 ENCOUNTER — Emergency Department (HOSPITAL_BASED_OUTPATIENT_CLINIC_OR_DEPARTMENT_OTHER): Payer: Self-pay

## 2022-04-03 ENCOUNTER — Encounter (HOSPITAL_BASED_OUTPATIENT_CLINIC_OR_DEPARTMENT_OTHER): Payer: Self-pay | Admitting: Emergency Medicine

## 2022-04-03 ENCOUNTER — Other Ambulatory Visit: Payer: Self-pay

## 2022-04-03 DIAGNOSIS — J45909 Unspecified asthma, uncomplicated: Secondary | ICD-10-CM | POA: Insufficient documentation

## 2022-04-03 DIAGNOSIS — R112 Nausea with vomiting, unspecified: Secondary | ICD-10-CM

## 2022-04-03 DIAGNOSIS — K561 Intussusception: Secondary | ICD-10-CM | POA: Insufficient documentation

## 2022-04-03 HISTORY — DX: Pyoderma gangrenosum: L88

## 2022-04-03 LAB — CBC WITH DIFFERENTIAL/PLATELET
Abs Immature Granulocytes: 0.03 10*3/uL (ref 0.00–0.07)
Basophils Absolute: 0 10*3/uL (ref 0.0–0.1)
Basophils Relative: 0 %
Eosinophils Absolute: 0.1 10*3/uL (ref 0.0–0.5)
Eosinophils Relative: 1 %
HCT: 45.9 % (ref 36.0–46.0)
Hemoglobin: 14.7 g/dL (ref 12.0–15.0)
Immature Granulocytes: 0 %
Lymphocytes Relative: 19 %
Lymphs Abs: 2.5 10*3/uL (ref 0.7–4.0)
MCH: 27 pg (ref 26.0–34.0)
MCHC: 32 g/dL (ref 30.0–36.0)
MCV: 84.2 fL (ref 80.0–100.0)
Monocytes Absolute: 1.1 10*3/uL — ABNORMAL HIGH (ref 0.1–1.0)
Monocytes Relative: 8 %
Neutro Abs: 9.6 10*3/uL — ABNORMAL HIGH (ref 1.7–7.7)
Neutrophils Relative %: 72 %
Platelets: 324 10*3/uL (ref 150–400)
RBC: 5.45 MIL/uL — ABNORMAL HIGH (ref 3.87–5.11)
RDW: 15.2 % (ref 11.5–15.5)
WBC: 13.3 10*3/uL — ABNORMAL HIGH (ref 4.0–10.5)
nRBC: 0 % (ref 0.0–0.2)

## 2022-04-03 LAB — LIPASE, BLOOD: Lipase: 32 U/L (ref 11–51)

## 2022-04-03 LAB — URINALYSIS, ROUTINE W REFLEX MICROSCOPIC
Glucose, UA: NEGATIVE mg/dL
Ketones, ur: NEGATIVE mg/dL
Leukocytes,Ua: NEGATIVE
Nitrite: NEGATIVE
Protein, ur: 100 mg/dL — AB
Specific Gravity, Urine: >=1.03 (ref 1.005–1.030)
pH: 5.5 (ref 5.0–8.0)

## 2022-04-03 LAB — COMPREHENSIVE METABOLIC PANEL
ALT: 13 U/L (ref 0–44)
AST: 19 U/L (ref 15–41)
Albumin: 3.9 g/dL (ref 3.5–5.0)
Alkaline Phosphatase: 100 U/L (ref 38–126)
Anion gap: 11 (ref 5–15)
BUN: 9 mg/dL (ref 6–20)
CO2: 20 mmol/L — ABNORMAL LOW (ref 22–32)
Calcium: 8.8 mg/dL — ABNORMAL LOW (ref 8.9–10.3)
Chloride: 104 mmol/L (ref 98–111)
Creatinine, Ser: 0.84 mg/dL (ref 0.44–1.00)
GFR, Estimated: >60 mL/min (ref >60)
Glucose, Bld: 102 mg/dL — ABNORMAL HIGH (ref 70–99)
Potassium: 3.6 mmol/L (ref 3.5–5.1)
Sodium: 135 mmol/L (ref 135–145)
Total Bilirubin: 0.2 mg/dL — ABNORMAL LOW (ref 0.3–1.2)
Total Protein: 8 g/dL (ref 6.5–8.1)

## 2022-04-03 LAB — URINALYSIS, MICROSCOPIC (REFLEX)

## 2022-04-03 LAB — PREGNANCY, URINE: Preg Test, Ur: NEGATIVE

## 2022-04-03 MED ORDER — METOCLOPRAMIDE HCL 5 MG/ML IJ SOLN
10.0000 mg | Freq: Once | INTRAMUSCULAR | Status: AC
  Administered 2022-04-03: 10 mg via INTRAVENOUS
  Filled 2022-04-03: qty 2

## 2022-04-03 MED ORDER — SODIUM CHLORIDE 0.9 % IV BOLUS
1000.0000 mL | Freq: Once | INTRAVENOUS | Status: AC
  Administered 2022-04-03: 1000 mL via INTRAVENOUS

## 2022-04-03 MED ORDER — HYDROCODONE-ACETAMINOPHEN 5-325 MG PO TABS: 2.0000 | ORAL_TABLET | Freq: Four times a day (QID) | ORAL | 0 refills | Status: AC | PRN

## 2022-04-03 MED ORDER — FENTANYL CITRATE PF 50 MCG/ML IJ SOSY
50.0000 ug | PREFILLED_SYRINGE | Freq: Once | INTRAMUSCULAR | Status: AC
  Administered 2022-04-03: 50 ug via INTRAVENOUS
  Filled 2022-04-03: qty 1

## 2022-04-03 MED ORDER — IOHEXOL 300 MG/ML  SOLN
80.0000 mL | Freq: Once | INTRAMUSCULAR | Status: AC | PRN
  Administered 2022-04-03: 80 mL via INTRAVENOUS

## 2022-04-03 MED ORDER — METOCLOPRAMIDE HCL 10 MG PO TABS: 10.0000 mg | ORAL_TABLET | Freq: Four times a day (QID) | ORAL | 0 refills | Status: DC | PRN

## 2022-04-03 NOTE — ED Triage Notes (Signed)
Vomiting X 3 day with right upper abdominal pain, unable to keep down food or liquids.

## 2022-04-03 NOTE — ED Provider Notes (Signed)
[facility] [facility] Provider Note: Georgena Spurling, MD, FACEP  CSN: [id number] MRN: [id number] ARRIVAL: 04/03/22 at 0343 ROOM: [facility]06/[facility]06   CHIEF COMPLAINT  Vomiting   HISTORY OF PRESENT ILLNESS  04/03/22 3:56 AM Amber Tyler is a 27 y.o. female with nausea and for the past 3 days.  She has not been able to keep anything down.  She has not had a fever or diarrhea with this.  She has had right upper quadrant pain.  The right upper quadrant pain was initially intermittent but since about midnight has become persistent.  She rates it as a 4 out of 10 at rest but about a 7 out of 10 when she is vomiting.  The pain is also worse with palpation.   Past Medical History:  Diagnosis Date   Asthma    Fibromyalgia    POTS (postural orthostatic tachycardia syndrome)    Pyoderma gangrenosum     Past Surgical History:  Procedure Laterality Date   ADENOIDECTOMY     ANKLE SURGERY Right    BREAST SURGERY     internal heart monitor     KNEE SURGERY     TONSILLECTOMY      Family History  Problem Relation Age of Onset   Diabetes Neg Hx     Social History   Tobacco Use   Smoking status: Never   Smokeless tobacco: Never  Substance Use Topics   Alcohol use: Not Currently    Comment: occ   Drug use: No    Prior to Admission medications   Medication Sig Start Date End Date Taking? Authorizing Provider  HYDROcodone-acetaminophen (NORCO) 5-325 MG tablet Take 2 tablets by mouth every 6 (six) hours as needed (for abdominal pain). 04/03/22  Yes Nikole Swartzentruber, MD  metoCLOPramide (REGLAN) 10 MG tablet Take 1 tablet (10 mg total) by mouth every 6 (six) hours as needed for nausea or vomiting. 04/03/22  Yes Shawnta Schlegel, MD  busPIRone (BUSPAR) 7.5 MG tablet Take 1 tablet (7.5 mg total) by mouth 2 (two) times daily. 03/24/16   Dickie La, MD  DULoxetine (CYMBALTA) 60 MG capsule Take 60 mg by mouth daily.    [provider]  fluticasone (FLONASE) 50 MCG/ACT nasal spray Place 2 sprays  into both nostrils daily. 10/19/16   Mayo, Pete Pelt, MD  lamoTRIgine (LAMICTAL) 25 MG tablet Take 50 mg by mouth daily. Take 25 mg for two weeks and then 50 mg.  Per Dr. Tammi Klippel in [facility].  #60 with one refill 12/29/16 02/02/17  Vickii Penna, MD  lithium carbonate 300 MG capsule Take 3 capsules (900 mg total) by mouth daily with supper. 12/01/16   Vickii Penna, MD  lurasidone (LATUDA) 20 MG TABS tablet Take 1 tablet (20 mg total) by mouth daily with supper. 12/01/16   Vickii Penna, MD  norgestimate-ethinyl estradiol (ORTHO-CYCLEN,SPRINTEC,PREVIFEM) 0.25-35 MG-MCG tablet Take 1 tablet by mouth daily. 05/13/16   Dickie La, MD  nortriptyline (PAMELOR) 10 MG capsule Take 1 capsule (10 mg total) by mouth at bedtime. 10/19/16   Mayo, Pete Pelt, MD    Allergies Ondansetron hcl, Sulfonamide derivatives, and Tape   REVIEW OF SYSTEMS  Negative except as noted here or in the History of Present Illness.   PHYSICAL EXAMINATION  Initial Vital Signs Blood pressure (!) 123/97, pulse 99, temperature 97.7 F (36.5 C), temperature source Oral, resp. rate 20, height '5\' 5"'$  (1.651 m), weight 77.1 kg, SpO2 96 %.  Examination General: Well-developed, well-nourished female  in no acute distress; appearance consistent with age of record HENT: normocephalic; atraumatic Eyes: Normal appearance Neck: supple Heart: regular rate and rhythm Lungs: clear to auscultation bilaterally Abdomen: soft; nondistended; right upper quadrant tenderness; bowel sounds present Extremities: No deformity; full range of motion; pulses normal Neurologic: Awake, alert and oriented; motor function intact in all extremities and symmetric; no facial droop Skin: Warm and dry Psychiatric: Normal mood and affect   RESULTS  Summary of this visit's results, reviewed and interpreted by myself:   EKG Interpretation  Date/Time:    Ventricular Rate:    PR Interval:    QRS Duration:   QT Interval:    QTC Calculation:   R Axis:      Text Interpretation:         Laboratory Studies: Results for orders placed or performed during the hospital encounter of 04/03/22 (from the past 24 hour(s))  Urinalysis, Routine w reflex microscopic Urine, Clean Catch     Status: Abnormal   Collection Time: 04/03/22  4:23 AM  Result Value Ref Range   Color, Urine YELLOW YELLOW   APPearance HAZY (A) CLEAR   Specific Gravity, Urine >=1.030 1.005 - 1.030   pH 5.5 5.0 - 8.0   Glucose, UA NEGATIVE NEGATIVE mg/dL   Hgb urine dipstick MODERATE (A) NEGATIVE   Bilirubin Urine SMALL (A) NEGATIVE   Ketones, ur NEGATIVE NEGATIVE mg/dL   Protein, ur 100 (A) NEGATIVE mg/dL   Nitrite NEGATIVE NEGATIVE   Leukocytes,Ua NEGATIVE NEGATIVE  Urinalysis, Microscopic (reflex)     Status: Abnormal   Collection Time: 04/03/22  4:23 AM  Result Value Ref Range   RBC / HPF 6-10 0 - 5 RBC/hpf   WBC, UA 0-5 0 - 5 WBC/hpf   Bacteria, UA MANY (A) NONE SEEN   Squamous Epithelial / HPF 0-5 0 - 5 /HPF   Non Squamous Epithelial PRESENT (A) NONE SEEN   Mucus PRESENT   Pregnancy, urine     Status: None   Collection Time: 04/03/22  4:31 AM  Result Value Ref Range   Preg Test, Ur NEGATIVE NEGATIVE  CBC with Differential     Status: Abnormal   Collection Time: 04/03/22  4:32 AM  Result Value Ref Range   WBC 13.3 (H) 4.0 - 10.5 K/uL   RBC 5.45 (H) 3.87 - 5.11 MIL/uL   Hemoglobin 14.7 12.0 - 15.0 g/dL   HCT 45.9 36.0 - 46.0 %   MCV 84.2 80.0 - 100.0 fL   MCH 27.0 26.0 - 34.0 pg   MCHC 32.0 30.0 - 36.0 g/dL   RDW 15.2 11.5 - 15.5 %   Platelets 324 150 - 400 K/uL   nRBC 0.0 0.0 - 0.2 %   Neutrophils Relative % 72 %   Neutro Abs 9.6 (H) 1.7 - 7.7 K/uL   Lymphocytes Relative 19 %   Lymphs Abs 2.5 0.7 - 4.0 K/uL   Monocytes Relative 8 %   Monocytes Absolute 1.1 (H) 0.1 - 1.0 K/uL   Eosinophils Relative 1 %   Eosinophils Absolute 0.1 0.0 - 0.5 K/uL   Basophils Relative 0 %   Basophils Absolute 0.0 0.0 - 0.1 K/uL   Immature Granulocytes 0 %   Abs Immature  Granulocytes 0.03 0.00 - 0.07 K/uL  Comprehensive metabolic panel     Status: Abnormal   Collection Time: 04/03/22  4:32 AM  Result Value Ref Range   Sodium 135 135 - 145 mmol/L   Potassium 3.6 3.5 - 5.1 mmol/L  Chloride 104 98 - 111 mmol/L   CO2 20 (L) 22 - 32 mmol/L   Glucose, Bld 102 (H) 70 - 99 mg/dL   BUN 9 6 - 20 mg/dL   Creatinine, Ser 0.84 0.44 - 1.00 mg/dL   Calcium 8.8 (L) 8.9 - 10.3 mg/dL   Total Protein 8.0 6.5 - 8.1 g/dL   Albumin 3.9 3.5 - 5.0 g/dL   AST 19 15 - 41 U/L   ALT 13 0 - 44 U/L   Alkaline Phosphatase 100 38 - 126 U/L   Total Bilirubin 0.2 (L) 0.3 - 1.2 mg/dL   GFR, Estimated >60 >60 mL/min   Anion gap 11 5 - 15  Lipase, blood     Status: None   Collection Time: 04/03/22  4:32 AM  Result Value Ref Range   Lipase 32 11 - 51 U/L   Imaging Studies: CT ABDOMEN PELVIS W CONTRAST  Result Date: 04/03/2022 CLINICAL DATA:  Vomiting for 3 days. Epigastric and right upper quadrant pain EXAM: CT ABDOMEN AND PELVIS WITH CONTRAST TECHNIQUE: Multidetector CT imaging of the abdomen and pelvis was performed using the standard protocol following bolus administration of intravenous contrast. RADIATION DOSE REDUCTION: This exam was performed according to the departmental dose-optimization program which includes automated exposure control, adjustment of the mA and/or kV according to patient size and/or use of iterative reconstruction technique. CONTRAST:  81m OMNIPAQUE IOHEXOL 300 MG/ML  SOLN COMPARISON:  12/19/2016 FINDINGS: Lower chest:  Clear lower lungs Hepatobiliary: Tiny low-density in the subcapsular high right lobe liver, too small for densitometry., not particularly worrisome in isolation.No evidence of biliary obstruction or stone. Pancreas: Unremarkable. Spleen: Unremarkable. Adrenals/Urinary Tract: Negative adrenals. No hydronephrosis or stone. Unremarkable bladder. Stomach/Bowel: Enteroenteric intussusception in the left upper quadrant measuring 4 cm in length and  nonobstructive. No underlying lesion is noted. Subjective prominence of small bowel fluid which may be related to patient's gastroenteritis history, no bowel wall thickening. No appendicitis. Vascular/Lymphatic: No acute vascular abnormality. No mass or adenopathy. Reproductive:No pathologic findings.  Tampon present Other: No ascites or pneumoperitoneum. Musculoskeletal: No acute abnormalities. IMPRESSION: Short segment enteroenteric intussusception, nonobstructive and usually transient/incidental in isolation. No bowel wall thickening or appendicitis. Electronically Signed   By: JJorje GuildM.D.   On: 04/03/2022 06:00    ED COURSE and MDM  Nursing notes, initial and subsequent vitals signs, including pulse oximetry, reviewed and interpreted by myself.  Vitals:   04/03/22 0354 04/03/22 0355 04/03/22 0430 04/03/22 0515  BP: (!) 123/97  120/74 108/68  Pulse: 99  84 74  Resp: '20  18 18  '$ Temp: 97.7 F (36.5 C)     TempSrc: Oral     SpO2: 96%  94% 97%  Weight:  77.1 kg    Height:  '5\' 5"'$  (1.651 m)     Medications  sodium chloride 0.9 % bolus 1,000 mL (1,000 mLs Intravenous New Bag/Given 04/03/22 0420)  metoCLOPramide (REGLAN) injection 10 mg (10 mg Intravenous Given 04/03/22 0421)  fentaNYL (SUBLIMAZE) injection 50 mcg (50 mcg Intravenous Given 04/03/22 0421)  iohexol (OMNIPAQUE) 300 MG/ML solution 80 mL (80 mLs Intravenous Contrast Given 04/03/22 0551)  fentaNYL (SUBLIMAZE) injection 50 mcg (50 mcg Intravenous Given 04/03/22 07622   6:09 AM Patient advised of CT findings and the usually benign/transient nature of the enteroenteric intussusception she has.  She was advised that if symptoms are worsening over the next 24 hours she should return otherwise she should anticipate resolution.  This may have started as a result  of gastroenteritis given that she had nausea and vomiting preceding the onset of pain.   PROCEDURES  Procedures   ED DIAGNOSES     ICD-10-CM   1. Nausea and vomiting in adult   R11.2     2. Intussusception of small intestine ([place])  K56.1          Asah Lamay, MD 04/03/22 [phone]

## 2022-06-04 ENCOUNTER — Other Ambulatory Visit: Payer: Self-pay

## 2022-06-04 ENCOUNTER — Emergency Department (HOSPITAL_BASED_OUTPATIENT_CLINIC_OR_DEPARTMENT_OTHER): Payer: BC Managed Care – PPO

## 2022-06-04 ENCOUNTER — Encounter (HOSPITAL_BASED_OUTPATIENT_CLINIC_OR_DEPARTMENT_OTHER): Payer: Self-pay | Admitting: Emergency Medicine

## 2022-06-04 ENCOUNTER — Emergency Department (HOSPITAL_BASED_OUTPATIENT_CLINIC_OR_DEPARTMENT_OTHER)
Admission: EM | Admit: 2022-06-04 | Discharge: 2022-06-04 | Disposition: A | Discharge status: Home / Self Care | Payer: BC Managed Care – PPO | Attending: Emergency Medicine | Admitting: Emergency Medicine

## 2022-06-04 DIAGNOSIS — Y92410 Unspecified street and highway as the place of occurrence of the external cause: Secondary | ICD-10-CM | POA: Diagnosis not present

## 2022-06-04 DIAGNOSIS — M25562 Pain in left knee: Secondary | ICD-10-CM

## 2022-06-04 DIAGNOSIS — S8002XA Contusion of left knee, initial encounter: Secondary | ICD-10-CM | POA: Insufficient documentation

## 2022-06-04 DIAGNOSIS — S8992XA Unspecified injury of left lower leg, initial encounter: Secondary | ICD-10-CM | POA: Diagnosis present
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
## 2022-06-04 NOTE — Discharge Instructions (Addendum)
Please call the orthopedist I have attached for you or an orthopedist of your choosing.  You may take ibuprofen or Tylenol every 6 hours as needed for pain.  I have attached a work note for you as well.

## 2022-07-12 ENCOUNTER — Encounter: Payer: Self-pay | Admitting: *Deleted

## 2023-06-15 ENCOUNTER — Emergency Department (HOSPITAL_COMMUNITY): Admission: EM | Admit: 2023-06-15 | Source: Home / Self Care

## 2023-06-15 ENCOUNTER — Emergency Department (HOSPITAL_BASED_OUTPATIENT_CLINIC_OR_DEPARTMENT_OTHER)
Admission: EM | Admit: 2023-06-15 | Discharge: 2023-06-15 | Disposition: A | Discharge status: Home / Self Care | Attending: Emergency Medicine | Admitting: Emergency Medicine

## 2023-06-15 ENCOUNTER — Emergency Department (HOSPITAL_BASED_OUTPATIENT_CLINIC_OR_DEPARTMENT_OTHER)

## 2023-06-15 ENCOUNTER — Other Ambulatory Visit: Payer: Self-pay

## 2023-06-15 ENCOUNTER — Encounter (HOSPITAL_BASED_OUTPATIENT_CLINIC_OR_DEPARTMENT_OTHER): Payer: Self-pay | Admitting: Emergency Medicine

## 2023-06-15 DIAGNOSIS — R112 Nausea with vomiting, unspecified: Secondary | ICD-10-CM | POA: Insufficient documentation

## 2023-06-15 DIAGNOSIS — R1011 Right upper quadrant pain: Secondary | ICD-10-CM | POA: Diagnosis present

## 2023-06-15 DIAGNOSIS — J45909 Unspecified asthma, uncomplicated: Secondary | ICD-10-CM | POA: Diagnosis not present

## 2023-06-15 LAB — CBC WITH DIFFERENTIAL/PLATELET
Abs Immature Granulocytes: 0.03 10*3/uL (ref 0.00–0.07)
Basophils Absolute: 0 10*3/uL (ref 0.0–0.1)
Basophils Relative: 0 %
Eosinophils Absolute: 0 10*3/uL (ref 0.0–0.5)
Eosinophils Relative: 0 %
HCT: 40.3 % (ref 36.0–46.0)
Hemoglobin: 13.3 g/dL (ref 12.0–15.0)
Immature Granulocytes: 0 %
Lymphocytes Relative: 7 %
Lymphs Abs: 0.9 10*3/uL (ref 0.7–4.0)
MCH: 27.5 pg (ref 26.0–34.0)
MCHC: 33 g/dL (ref 30.0–36.0)
MCV: 83.3 fL (ref 80.0–100.0)
Monocytes Absolute: 0.6 10*3/uL (ref 0.1–1.0)
Monocytes Relative: 5 %
Neutro Abs: 10.3 10*3/uL — ABNORMAL HIGH (ref 1.7–7.7)
Neutrophils Relative %: 88 %
Platelets: 449 10*3/uL — ABNORMAL HIGH (ref 150–400)
RBC: 4.84 MIL/uL (ref 3.87–5.11)
RDW: 14.5 % (ref 11.5–15.5)
WBC: 11.8 10*3/uL — ABNORMAL HIGH (ref 4.0–10.5)
nRBC: 0 % (ref 0.0–0.2)

## 2023-06-15 LAB — COMPREHENSIVE METABOLIC PANEL
ALT: 14 U/L (ref 0–44)
AST: 20 U/L (ref 15–41)
Albumin: 3.8 g/dL (ref 3.5–5.0)
Alkaline Phosphatase: 96 U/L (ref 38–126)
Anion gap: 9 (ref 5–15)
BUN: 11 mg/dL (ref 6–20)
CO2: 23 mmol/L (ref 22–32)
Calcium: 9 mg/dL (ref 8.9–10.3)
Chloride: 104 mmol/L (ref 98–111)
Creatinine, Ser: 0.78 mg/dL (ref 0.44–1.00)
GFR, Estimated: >60 mL/min (ref >60)
Glucose, Bld: 107 mg/dL — ABNORMAL HIGH (ref 70–99)
Potassium: 4 mmol/L (ref 3.5–5.1)
Sodium: 136 mmol/L (ref 135–145)
Total Bilirubin: 1 mg/dL (ref 0.0–1.2)
Total Protein: 7.5 g/dL (ref 6.5–8.1)

## 2023-06-15 LAB — URINALYSIS, ROUTINE W REFLEX MICROSCOPIC
Bilirubin Urine: NEGATIVE
Glucose, UA: NEGATIVE mg/dL
Hgb urine dipstick: NEGATIVE
Ketones, ur: NEGATIVE mg/dL
Leukocytes,Ua: NEGATIVE
Nitrite: NEGATIVE
Protein, ur: NEGATIVE mg/dL
Specific Gravity, Urine: 1.025 (ref 1.005–1.030)
pH: 6.5 (ref 5.0–8.0)

## 2023-06-15 LAB — LIPASE, BLOOD: Lipase: 25 U/L (ref 11–51)

## 2023-06-15 LAB — PREGNANCY, URINE: Preg Test, Ur: NEGATIVE

## 2023-06-15 MED ORDER — METOCLOPRAMIDE HCL 5 MG/ML IJ SOLN
5.0000 mg | Freq: Once | INTRAMUSCULAR | Status: AC
  Administered 2023-06-15: 5 mg via INTRAVENOUS
  Filled 2023-06-15: qty 2

## 2023-06-15 MED ORDER — MORPHINE SULFATE (PF) 4 MG/ML IV SOLN
4.0000 mg | Freq: Once | INTRAVENOUS | Status: AC
  Administered 2023-06-15: 4 mg via INTRAVENOUS
  Filled 2023-06-15: qty 1

## 2023-06-15 MED ORDER — IOHEXOL 300 MG/ML  SOLN
100.0000 mL | Freq: Once | INTRAMUSCULAR | Status: AC | PRN
  Administered 2023-06-15: 100 mL via INTRAVENOUS

## 2023-06-15 MED ORDER — SODIUM CHLORIDE 0.9 % IV BOLUS
1000.0000 mL | Freq: Once | INTRAVENOUS | Status: AC
  Administered 2023-06-15: 1000 mL via INTRAVENOUS

## 2023-06-15 MED ORDER — OXYCODONE-ACETAMINOPHEN 5-325 MG PO TABS: 1.0000 | ORAL_TABLET | Freq: Four times a day (QID) | ORAL | 0 refills | Status: AC | PRN

## 2023-06-15 NOTE — ED Provider Notes (Signed)
 Patient transfer from [facility] [place] to the ER for evaluation of abdominal pain.  Please refer to previous provider note for complete H&P.  This is a 28 year old female presenting with complaint of pain to right upper quadrant that started last night with associate nausea and vomiting.  Pain is quite intense.  Workup remarkable for evidence of distended gallbladder without gallstone.  Normal common bile duct.  Pain still persist.  A HIDA scan have been ordered but will not be done until tomorrow morning.  I did discuss this with patient.  At this time patient felt she would prefer to return in the morning for the HIDA scan as the pain is improving.  I have reached out to the nuclear medicine team to ensure the patient has an appointment.  She will need to return back to the [facility] at 7:30 AM tomorrow for a scan.  Patient made aware to stay n.p.o. at midnight as well as avoiding pain medication after midnight.  BP 100/71   Pulse 93   Temp 98.7 F (37.1 C) (Oral)   Resp 18   Ht 5\' 5"  (1.651 m)   Wt 81.6 kg   LMP 06/08/2023 (Approximate)   SpO2 100%   BMI 29.95 kg/m   Results for orders placed or performed during the hospital encounter of 06/15/23  Lipase, blood   Collection Time: 06/15/23  9:26 AM  Result Value Ref Range   Lipase 25 11 - 51 U/L  Comprehensive metabolic panel   Collection Time: 06/15/23  9:26 AM  Result Value Ref Range   Sodium 136 135 - 145 mmol/L   Potassium 4.0 3.5 - 5.1 mmol/L   Chloride 104 98 - 111 mmol/L   CO2 23 22 - 32 mmol/L   Glucose, Bld 107 (H) 70 - 99 mg/dL   BUN 11 6 - 20 mg/dL   Creatinine, Ser 0.27 0.44 - 1.00 mg/dL   Calcium 9.0 8.9 - 25.3 mg/dL   Total Protein 7.5 6.5 - 8.1 g/dL   Albumin 3.8 3.5 - 5.0 g/dL   AST 20 15 - 41 U/L   ALT 14 0 - 44 U/L   Alkaline Phosphatase 96 38 - 126 U/L   Total Bilirubin 1.0 0.0 - 1.2 mg/dL   GFR, Estimated >66 >44 mL/min   Anion gap 9 5 - 15  Urinalysis, Routine w reflex microscopic -Urine, Clean  Catch   Collection Time: 06/15/23  9:26 AM  Result Value Ref Range   Color, Urine YELLOW YELLOW   APPearance CLEAR CLEAR   Specific Gravity, Urine 1.025 1.005 - 1.030   pH 6.5 5.0 - 8.0   Glucose, UA NEGATIVE NEGATIVE mg/dL   Hgb urine dipstick NEGATIVE NEGATIVE   Bilirubin Urine NEGATIVE NEGATIVE   Ketones, ur NEGATIVE NEGATIVE mg/dL   Protein, ur NEGATIVE NEGATIVE mg/dL   Nitrite NEGATIVE NEGATIVE   Leukocytes,Ua NEGATIVE NEGATIVE  CBC with Differential   Collection Time: 06/15/23  9:26 AM  Result Value Ref Range   WBC 11.8 (H) 4.0 - 10.5 K/uL   RBC 4.84 3.87 - 5.11 MIL/uL   Hemoglobin 13.3 12.0 - 15.0 g/dL   HCT 03.4 74.2 - 59.5 %   MCV 83.3 80.0 - 100.0 fL   MCH 27.5 26.0 - 34.0 pg   MCHC 33.0 30.0 - 36.0 g/dL   RDW 63.8 75.6 - 43.3 %   Platelets 449 (H) 150 - 400 K/uL   nRBC 0.0 0.0 - 0.2 %   Neutrophils Relative %  88 %   Neutro Abs 10.3 (H) 1.7 - 7.7 K/uL   Lymphocytes Relative 7 %   Lymphs Abs 0.9 0.7 - 4.0 K/uL   Monocytes Relative 5 %   Monocytes Absolute 0.6 0.1 - 1.0 K/uL   Eosinophils Relative 0 %   Eosinophils Absolute 0.0 0.0 - 0.5 K/uL   Basophils Relative 0 %   Basophils Absolute 0.0 0.0 - 0.1 K/uL   Immature Granulocytes 0 %   Abs Immature Granulocytes 0.03 0.00 - 0.07 K/uL  Pregnancy, urine   Collection Time: 06/15/23  9:30 AM  Result Value Ref Range   Preg Test, Ur NEGATIVE NEGATIVE   CT ABDOMEN PELVIS W CONTRAST Result Date: 06/15/2023 CLINICAL DATA:  Right lower quadrant pain. EXAM: CT ABDOMEN AND PELVIS WITH CONTRAST TECHNIQUE: Multidetector CT imaging of the abdomen and pelvis was performed using the standard protocol following bolus administration of intravenous contrast. RADIATION DOSE REDUCTION: This exam was performed according to the departmental dose-optimization program which includes automated exposure control, adjustment of the mA and/or kV according to patient size and/or use of iterative reconstruction technique. CONTRAST:   OMNIPAQUE IOHEXOL 300 MG/ML  SOLN COMPARISON:  CT 04/03/2022 and older. FINDINGS: Lower chest: Slight breathing motion lung bases. No pleural effusion. 3 mm juxtapleural nodule on image 1 of series 302, unchanged from previous examination. Over 1 year stability. No additional follow-up. Hepatobiliary: Stable tiny dome segment 4 cystic focus on series 301, image 9, benign. Focal fat deposition seen liver as well adjacent to the falciform ligament in segment 4. Patent portal vein. Preserved hepatic parenchyma. Gallbladder is dilated. Please see separate ultrasound Pancreas: Unremarkable. No pancreatic ductal dilatation or surrounding inflammatory changes. Spleen: Normal in size without focal abnormality. Adrenals/Urinary Tract: Adrenal glands are unremarkable. Kidneys are normal, without renal calculi, focal lesion, or hydronephrosis. Bladder is unremarkable. Stomach/Bowel: Stomach is distended with fluid. Large bowel has a normal course and caliber with moderate colonic stool. Normal appendix extends inferior to the cecum in the right hemipelvis. Small bowel is nondilated. There are several loops small bowel which are fluid-filled, nonspecific. Preserved terminal ileum. Some minimal fat along the course of the terminal ileum, nonspecific. This was seen previously. No obstruction. Vascular/Lymphatic: No significant vascular findings are present. No enlarged abdominal or pelvic lymph nodes. Reproductive: Uterus is present. There is some thickening and heterogeneity along the endometrium. There is also a cystic lesion involving the left ovary measuring 21 mm. Likely a functional cyst. Other: No free air or free fluid. Umbilical jewelry. Patient is tilted in the scanner. Musculoskeletal: Minimal Schmorl's node changes along the spine. IMPRESSION: No bowel obstruction, free air or free fluid. Several loops small bowel are fluid-filled, nonspecific. Normal appendix. Distended gallbladder. Slightly thickened and  heterogeneous along the endometrium. Small cystic focus along the left ovary. Please correlate with the patient's cycle and symptomatology. Follow up ultrasound as clinically appropriate Electronically Signed   By: Karen Kays M.D.   On: 06/15/2023 11:44   US Abdomen Limited RUQ (LIVER/GB) Result Date: 06/15/2023 CLINICAL DATA:  Right upper quadrant pain EXAM: ULTRASOUND ABDOMEN LIMITED RIGHT UPPER QUADRANT COMPARISON:  CT 04/03/2022. Ultrasound 08/27/2017. Older exams as well FINDINGS: Gallbladder: Dilated gallbladder. No shadowing stones, wall thickening or adjacent fluid. Common bile duct: Diameter: 3 mm Liver: No focal lesion identified. Within normal limits in parenchymal echogenicity. Portal vein is patent on color Doppler imaging with normal direction of blood flow towards the liver. Other: None. IMPRESSION: Dilated gallbladder.  No shadowing stones or ductal  dilatation. Electronically Signed   By: Karen Kays M.D.   On: 06/15/2023 11:36        Fayrene Helper, PA-C 06/15/23 1725    Cathren Laine, MD 06/15/23 2107

## 2023-06-15 NOTE — Discharge Instructions (Addendum)
 Please return to [facility]  for your HIDA scan appointment at 7:30 AM tomorrow.  You may go through the main entrance and request to be seen by the radiology team for a HIDA scan.  Do not eat anything after midnight and do not take any pain medication after midnight.

## 2023-06-15 NOTE — ED Provider Notes (Signed)
 [place] [facility] Provider Note   CSN: [id number] Arrival date & time: 06/15/23  2952     History  Chief Complaint  Patient presents with   Abdominal Pain    Amber Tyler is a 28 y.o. female with a history of asthma, fibromyalgia, and POTS presents the ED today for abdominal pain.  Patient reports right upper quadrant pain with nausea and vomiting that began last night.  She states it went away but then returned again at 5 AM this morning and has been persistent since.  States that last night when the symptoms for started she had diarrhea.  At that time she said it felt like when she had intussusception in the past but she states that this morning the pain is worse.  Denies any associated fevers.  Patient is able to pass flatus.  She has not been able to hold down any medications or fluids by mouth since onset of symptoms.  No history of abdominal surgeries in the past.  No additional complaints or concerns at this time.    Home Medications Prior to Admission medications   Medication Sig Start Date End Date Taking? Authorizing Provider  cyanocobalamin (VITAMIN B12) 1000 MCG/ML injection Inject 1,000 mcg into the muscle every 30 (thirty) days. 10/12/22  Yes [provider]  DULoxetine (CYMBALTA) 60 MG capsule Take 60 mg by mouth 2 (two) times daily.   Yes [provider]  EPINEPHrine 0.3 mg/0.3 mL IJ SOAJ injection Inject 0.3 mg into the muscle as needed for anaphylaxis. 02/15/18  Yes [provider]  lisdexamfetamine (VYVANSE) 70 MG capsule Take 70 mg by mouth in the morning. 05/30/23  Yes [provider]  tiZANidine (ZANAFLEX) 4 MG tablet Take 4 mg by mouth 2 (two) times daily. 03/14/23  Yes [provider]  busPIRone (BUSPAR) 7.5 MG tablet Take 1 tablet (7.5 mg total) by mouth 2 (two) times daily. Patient not taking: Reported on 06/15/2023 03/24/16   Nestor Ramp, MD  HYDROcodone-acetaminophen [facility])  5-325 MG tablet Take 2 tablets by mouth every 6 (six) hours as needed (for abdominal pain). Patient not taking: Reported on 06/15/2023 04/03/22   Molpus, John, MD  lithium carbonate 300 MG capsule Take 3 capsules (900 mg total) by mouth daily with supper. Patient not taking: Reported on 06/15/2023 12/01/16   Arlan Organ, MD  lurasidone (LATUDA) 20 MG TABS tablet Take 1 tablet (20 mg total) by mouth daily with supper. Patient not taking: Reported on 06/15/2023 12/01/16   Arlan Organ, MD  norgestimate-ethinyl estradiol (ORTHO-CYCLEN,SPRINTEC,PREVIFEM) 0.25-35 MG-MCG tablet Take 1 tablet by mouth daily. Patient not taking: Reported on 06/15/2023 05/13/16   Nestor Ramp, MD  nortriptyline (PAMELOR) 10 MG capsule Take 1 capsule (10 mg total) by mouth at bedtime. Patient not taking: Reported on 06/15/2023 10/19/16   MayoAllyn Kenner, MD      Allergies    Ondansetron hcl, Sulfonamide derivatives, Phentermine, and Tape    Review of Systems   Review of Systems  Gastrointestinal:  Positive for abdominal pain.  All other systems reviewed and are negative.   Physical Exam Updated Vital Signs BP 106/69   Pulse 87   Temp 98.1 F (36.7 C) (Oral)   Resp 19   Ht 5\' 5"  (1.651 m)   Wt 81.6 kg   LMP 06/08/2023 (Approximate)   SpO2 100%   BMI 29.95 kg/m  Physical Exam Vitals and nursing note reviewed.  Constitutional:  General: She is not in acute distress.    Appearance: Normal appearance.  HENT:     Head: Normocephalic and atraumatic.     Mouth/Throat:     Mouth: Mucous membranes are moist.  Eyes:     Conjunctiva/sclera: Conjunctivae normal.     Pupils: Pupils are equal, round, and reactive to light.  Cardiovascular:     Rate and Rhythm: Normal rate and regular rhythm.     Pulses: Normal pulses.     Heart sounds: Normal heart sounds.  Pulmonary:     Effort: Pulmonary effort is normal.     Breath sounds: Normal breath sounds.  Abdominal:     Palpations: Abdomen is soft.      Tenderness: There is abdominal tenderness.     Comments: Positive Murphy's sign  Skin:    General: Skin is warm and dry.     Findings: No rash.  Neurological:     General: No focal deficit present.     Mental Status: She is alert.  Psychiatric:        Mood and Affect: Mood normal.        Behavior: Behavior normal.    ED Results / Procedures / Treatments   Labs (all labs ordered are listed, but only abnormal results are displayed) Labs Reviewed  COMPREHENSIVE METABOLIC PANEL - Abnormal; Notable for the following components:      Result Value   Glucose, Bld 107 (*)    All other components within normal limits  CBC WITH DIFFERENTIAL/PLATELET - Abnormal; Notable for the following components:   WBC 11.8 (*)    Platelets 449 (*)    Neutro Abs 10.3 (*)    All other components within normal limits  LIPASE, BLOOD  URINALYSIS, ROUTINE W REFLEX MICROSCOPIC  PREGNANCY, URINE    EKG None  Radiology CT ABDOMEN PELVIS W CONTRAST Result Date: 06/15/2023 CLINICAL DATA:  Right lower quadrant pain. EXAM: CT ABDOMEN AND PELVIS WITH CONTRAST TECHNIQUE: Multidetector CT imaging of the abdomen and pelvis was performed using the standard protocol following bolus administration of intravenous contrast. RADIATION DOSE REDUCTION: This exam was performed according to the departmental dose-optimization program which includes automated exposure control, adjustment of the mA and/or kV according to patient size and/or use of iterative reconstruction technique. CONTRAST:  OMNIPAQUE IOHEXOL 300 MG/ML  SOLN COMPARISON:  CT 04/03/2022 and older. FINDINGS: Lower chest: Slight breathing motion lung bases. No pleural effusion. 3 mm juxtapleural nodule on image 1 of series 302, unchanged from previous examination. Over 1 year stability. No additional follow-up. Hepatobiliary: Stable tiny dome segment 4 cystic focus on series 301, image 9, benign. Focal fat deposition seen liver as well adjacent to the falciform  ligament in segment 4. Patent portal vein. Preserved hepatic parenchyma. Gallbladder is dilated. Please see separate ultrasound Pancreas: Unremarkable. No pancreatic ductal dilatation or surrounding inflammatory changes. Spleen: Normal in size without focal abnormality. Adrenals/Urinary Tract: Adrenal glands are unremarkable. Kidneys are normal, without renal calculi, focal lesion, or hydronephrosis. Bladder is unremarkable. Stomach/Bowel: Stomach is distended with fluid. Large bowel has a normal course and caliber with moderate colonic stool. Normal appendix extends inferior to the cecum in the right hemipelvis. Small bowel is nondilated. There are several loops small bowel which are fluid-filled, nonspecific. Preserved terminal ileum. Some minimal fat along the course of the terminal ileum, nonspecific. This was seen previously. No obstruction. Vascular/Lymphatic: No significant vascular findings are present. No enlarged abdominal or pelvic lymph nodes. Reproductive: Uterus is present. There is  some thickening and heterogeneity along the endometrium. There is also a cystic lesion involving the left ovary measuring 21 mm. Likely a functional cyst. Other: No free air or free fluid. Umbilical jewelry. Patient is tilted in the scanner. Musculoskeletal: Minimal Schmorl's node changes along the spine. IMPRESSION: No bowel obstruction, free air or free fluid. Several loops small bowel are fluid-filled, nonspecific. Normal appendix. Distended gallbladder. Slightly thickened and heterogeneous along the endometrium. Small cystic focus along the left ovary. Please correlate with the patient's cycle and symptomatology. Follow up ultrasound as clinically appropriate Electronically Signed   By: Karen Kays M.D.   On: 06/15/2023 11:44   US Abdomen Limited RUQ (LIVER/GB) Result Date: 06/15/2023 CLINICAL DATA:  Right upper quadrant pain EXAM: ULTRASOUND ABDOMEN LIMITED RIGHT UPPER QUADRANT COMPARISON:  CT 04/03/2022.  Ultrasound 08/27/2017. Older exams as well FINDINGS: Gallbladder: Dilated gallbladder. No shadowing stones, wall thickening or adjacent fluid. Common bile duct: Diameter: 3 mm Liver: No focal lesion identified. Within normal limits in parenchymal echogenicity. Portal vein is patent on color Doppler imaging with normal direction of blood flow towards the liver. Other: None. IMPRESSION: Dilated gallbladder.  No shadowing stones or ductal dilatation. Electronically Signed   By: Karen Kays M.D.   On: 06/15/2023 11:36    Procedures Procedures: not indicated.   Medications Ordered in ED Medications  morphine (PF) 4 MG/ML injection 4 mg (4 mg Intravenous Given 06/15/23 1000)  metoCLOPramide (REGLAN) injection 5 mg (5 mg Intravenous Given 06/15/23 1005)  sodium chloride 0.9 % bolus 1,000 mL (0 mLs Intravenous Stopped 06/15/23 1347)  iohexol (OMNIPAQUE) 300 MG/ML solution 100 mL (100 mLs Intravenous Contrast Given 06/15/23 1027)  morphine (PF) 4 MG/ML injection 4 mg (4 mg Intravenous Given 06/15/23 1401)  metoCLOPramide (REGLAN) injection 5 mg (5 mg Intravenous Given 06/15/23 1403)    ED Course/ Medical Decision Making/ A&P                                 Medical Decision Making Amount and/or Complexity of Data Reviewed Labs: ordered. Radiology: ordered.  Risk Prescription drug management.   This patient presents to the ED for concern of abdominal pain, this involves an extensive number of treatment options, and is a complaint that carries with it a high risk of complications and morbidity.   Differential diagnosis includes: gastritis, gastroenteritis, cholangitis, cholecystitis, choledocholithiasis, intussusception, bowel perforation, bowel obstruction, hepatitis, pancreatitis, etc.   Comorbidities  See HPI above   Additional History  Additional history obtained from prior records   Lab Tests  I ordered and personally interpreted labs.  The pertinent results include:   Elevated  WBC of 11.8, otherwise CBC is reassuring CMP, lipase, and UA are unremarkable Negative pregnancy test   Imaging Studies  I ordered imaging studies including CT abdomen/pelvis and RUQ ultrasound  I independently visualized and interpreted imaging which showed:  US shows dilated gallbladder with shadowing stones, wall thickening, or adjacent fluid. CBD diameter of 3 mm. I agree with the radiologist interpretation   Consultations  I requested consultation with Morrie Sheldon PA-C with general surgery,  and discussed lab and imaging findings as well as pertinent plan - they recommend: ED to ED transfer to [facility] for HIDA scan.  They will reevaluate after results are in.   Problem List / ED Course / Critical Interventions / Medication Management  Patient reports right upper quadrant pain that began last night with N/V.  Symptoms worsened this morning.  She has not been able to hold on anything by mouth since onset of symptoms.  She is teary-eyed on evaluation. Reports history of intussusception in the past and states that her pain first felt similar to that last night but now feels worse than that did.  No history of abdominal surgeries in the past. I ordered medications including: Morphine, Reglan, NS for pain and N/V  Reevaluation of the patient after these medicines showed that the patient improved to a "dull ache" and still had positive Murphy sign despite medications.  I have reviewed the patients home medicines and have made adjustments as needed   Social Determinants of Health  Access to healthcare   Test / Admission - Considered  Discussed findings with patient.  She is agreeable with plan for ED to ED transfer.       Final Clinical Impression(s) / ED Diagnoses Final diagnoses:  RUQ pain    Rx / DC Orders ED Discharge Orders     None         Maxwell Marion, PA-C 06/15/23 1519    Ernie Avena, MD 06/15/23 2050

## 2023-06-15 NOTE — ED Triage Notes (Signed)
 Upper right abdominal pain and n/v onset last night. Reports h/o intussusception of small intestine and states feels the same.

## 2023-06-16 ENCOUNTER — Encounter (HOSPITAL_COMMUNITY): Payer: Self-pay | Admitting: Emergency Medicine

## 2023-06-16 ENCOUNTER — Other Ambulatory Visit (HOSPITAL_COMMUNITY): Payer: Self-pay | Admitting: Emergency Medicine

## 2023-06-16 ENCOUNTER — Encounter (HOSPITAL_COMMUNITY): Payer: Self-pay

## 2023-06-16 ENCOUNTER — Telehealth: Payer: Self-pay | Admitting: Physician Assistant

## 2023-06-16 ENCOUNTER — Emergency Department (HOSPITAL_COMMUNITY): Admit: 2023-06-16

## 2023-06-16 ENCOUNTER — Ambulatory Visit (HOSPITAL_COMMUNITY)
Admission: RE | Admit: 2023-06-16 | Discharge: 2023-06-16 | Disposition: A | Discharge status: Home / Self Care | Source: Ambulatory Visit

## 2023-06-16 ENCOUNTER — Ambulatory Visit (HOSPITAL_COMMUNITY)
Admission: RE | Admit: 2023-06-16 | Discharge: 2023-06-16 | Disposition: A | Discharge status: Home / Self Care | Source: Ambulatory Visit | Attending: Emergency Medicine | Admitting: Emergency Medicine

## 2023-06-16 DIAGNOSIS — R1011 Right upper quadrant pain: Secondary | ICD-10-CM | POA: Diagnosis present

## 2023-06-16 MED ORDER — TECHNETIUM TC 99M MEBROFENIN IV KIT
5.4000 | PACK | Freq: Once | INTRAVENOUS | Status: AC
  Administered 2023-06-16: 5.4 via INTRAVENOUS

## 2023-06-16 NOTE — Telephone Encounter (Cosign Needed)
 Patient was seen in the ED at [facility] yesterday for abdominal pain, nausea and vomiting.  Chart reviewed.  She had a CT A/P that showed no evidence of bowel obstruction, free air or free fluid.  Normal appendix.  Distended gallbladder.  A RUQ [place] was obtained that showed a dilated gallbladder.  No shadowing stones, wall thickening or adjacent fluid.  CBD 3 mm.  WBC 11.8.  Lipase and LFTs within normal limits.  Pregnancy test negative.  She was transferred to [facility] for a HIDA scan to rule out acalculous cholecystitis.  HIDA scan was not to be done until the following morning so EDP discussed with the patient per note and given her symptomatic improvement she was discharged with outpatient HIDA scan ordered.  HIDA scan with EF was done today.  This showed prompt uptake and biliary excretion activity by the liver.  Gallbladder activity is visualized consistent with patency of the cystic duct.  Biliary activity passes in the small bowel, consistent with patent common bile duct.  The calculated gallbladder ejection fraction is 10% concerning for biliary dyskinesia.  I reviewed the above with my attending, Dr. Andrey Campanile who recommended follow-up in the office to discuss laparoscopic cholecystectomy.  We have arranged for follow-up in the office on Thursday, March 27 at 9 AM.  I called the patient to update her on the plan.  She states she is feeling better from when she presented to the ED.  Denies fevers.  She has been able to tolerate some CLD without further nausea or vomiting.  She denies frequent NSAID use.  No history of stomach ulcers in the past.   Recommended for her start with CLD diet and slowly advance to a low-fat diet as tolerated.  She is agreeable to seeing Dr. Andrey Campanile in the office on Thursday, March 27 at 9 AM, to discuss laparoscopic cholecystectomy.  Discussed location of the office and to arrive 30 minutes early for paperwork.  We discussed strict return precautions back to the ED if her  symptoms were to worsen.  If she was to require another visit to the ED, please call our team to alert [place]. She is agreeable with this plan. She reports she is thankful for the call.   Jacinto Halim, PA-C

## 2023-10-14 ENCOUNTER — Encounter: Payer: Self-pay | Admitting: Advanced Practice Midwife
